# Patient Record
Sex: Female | Born: 1935 | Race: White | Hispanic: No | Smoking: Never smoker
Health system: Southern US, Community
[De-identification: ages and names within clinical notes are randomized; demographics above are authoritative.]

## PROBLEM LIST (undated history)

## (undated) DIAGNOSIS — E785 Hyperlipidemia, unspecified: Secondary | ICD-10-CM

## (undated) DIAGNOSIS — J45909 Unspecified asthma, uncomplicated: Secondary | ICD-10-CM

## (undated) DIAGNOSIS — C801 Malignant (primary) neoplasm, unspecified: Secondary | ICD-10-CM

## (undated) DIAGNOSIS — D1720 Benign lipomatous neoplasm of skin and subcutaneous tissue of unspecified limb: Secondary | ICD-10-CM

## (undated) DIAGNOSIS — I639 Cerebral infarction, unspecified: Secondary | ICD-10-CM

## (undated) DIAGNOSIS — J449 Chronic obstructive pulmonary disease, unspecified: Secondary | ICD-10-CM

## (undated) DIAGNOSIS — K219 Gastro-esophageal reflux disease without esophagitis: Secondary | ICD-10-CM

## (undated) DIAGNOSIS — I10 Essential (primary) hypertension: Secondary | ICD-10-CM

## (undated) DIAGNOSIS — G2581 Restless legs syndrome: Secondary | ICD-10-CM

## (undated) DIAGNOSIS — M1990 Unspecified osteoarthritis, unspecified site: Secondary | ICD-10-CM

## (undated) DIAGNOSIS — S8992XA Unspecified injury of left lower leg, initial encounter: Secondary | ICD-10-CM

## (undated) DIAGNOSIS — M797 Fibromyalgia: Secondary | ICD-10-CM

## (undated) DIAGNOSIS — I739 Peripheral vascular disease, unspecified: Secondary | ICD-10-CM

## (undated) HISTORY — DX: Chronic obstructive pulmonary disease, unspecified: J44.9

## (undated) HISTORY — DX: Essential (primary) hypertension: I10

## (undated) HISTORY — DX: Gastro-esophageal reflux disease without esophagitis: K21.9

## (undated) HISTORY — DX: Restless legs syndrome: G25.81

## (undated) HISTORY — DX: Peripheral vascular disease, unspecified: I73.9

## (undated) HISTORY — DX: Unspecified injury of left lower leg, initial encounter: S89.92XA

## (undated) HISTORY — DX: Cerebral infarction, unspecified: I63.9

## (undated) HISTORY — DX: Hyperlipidemia, unspecified: E78.5

## (undated) HISTORY — DX: Benign lipomatous neoplasm of skin and subcutaneous tissue of unspecified limb: D17.20

## (undated) HISTORY — PX: RECTOCELE REPAIR: SHX761

## (undated) HISTORY — DX: Malignant (primary) neoplasm, unspecified: C80.1

---
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
## 1966-04-13 HISTORY — PX: THYROIDECTOMY: SHX17

## 1979-04-14 HISTORY — PX: ABDOMINAL HYSTERECTOMY: SHX81

## 1984-04-13 HISTORY — PX: BIOPSY BREAST: PRO8

## 1997-04-13 HISTORY — PX: EYE SURGERY: SHX253

## 1998-04-13 HISTORY — PX: EYE SURGERY: SHX253

## 2006-09-08 ENCOUNTER — Ambulatory Visit: Payer: Self-pay | Admitting: Cardiology

## 2007-12-01 ENCOUNTER — Ambulatory Visit: Payer: Self-pay | Admitting: Internal Medicine

## 2007-12-02 ENCOUNTER — Inpatient Hospital Stay (HOSPITAL_COMMUNITY): Admission: EM | Admit: 2007-12-02 | Discharge: 2007-12-03 | Payer: Self-pay | Admitting: Emergency Medicine

## 2007-12-02 ENCOUNTER — Encounter (INDEPENDENT_AMBULATORY_CARE_PROVIDER_SITE_OTHER): Payer: Self-pay | Admitting: Internal Medicine

## 2007-12-02 ENCOUNTER — Ambulatory Visit: Payer: Self-pay | Admitting: Vascular Surgery

## 2008-04-13 DIAGNOSIS — I639 Cerebral infarction, unspecified: Secondary | ICD-10-CM

## 2008-04-13 HISTORY — PX: EYE MUSCLE SURGERY: SHX370

## 2008-04-13 HISTORY — DX: Cerebral infarction, unspecified: I63.9

## 2010-08-26 NOTE — Consult Note (Signed)
[name], [name]                ACCOUNT NO.:  [id number]   MEDICAL RECORD NO.:  [id number]          PATIENT TYPE:  INP   LOCATION:  3003                         FACILITY:  [facility]   PHYSICIAN:  Deanna Artis. Hickling, M.D.DATE OF BIRTH:  December 08, 1921   DATE OF CONSULTATION:  12/02/2007  DATE OF DISCHARGE:                                 CONSULTATION   CHIEF COMPLAINT:  Left thalamic subacute infarction.   HISTORY OF THE PRESENT CONDITION:  The patient is an 77 year old married  right-handed woman with mild hypertension and untreated dyslipidemia  that is also mild.  The patient has been well virtually all of her life.  On Friday night, the patient experienced onset of lightheadedness and  tingling in both her hands.  She was able to get up and in fact she said  that she walked to the floor.  She finally retired around 10:30 or 11  and slept until the next morning.  On Saturday morning, the patient  noted that she had numbness and tingling on the right side.  I would  also like to mention that on Friday, the patient fell and sprained her  left ankle.  I do not think that had anything to do with her stroke.   On Sunday morning, she continued to have symptoms of right-sided  weakness and numbness and was seen by her primary physician on Monday,  who carried out an MRI scan and is going to have her worked up as an  outpatient in [place] and be seen by Dr. Lesia Sago at [facility] on December 08, 2007.  Her family, however, felt  that she should have workup more promptly and the patient was thus  admitted to [facility].   Her evaluation today shows an MRA that shows some mild intracranial  atherosclerotic disease with some narrowing in the proximal vessels, but  no definite stenosis in the vertebral arteries or the carotids.  There  is some narrowing distally in the basilar artery, but robust vessels  coming off the superior cerebellar and posterior cerebral  arteries.  Distally, in all of the branches, there is some mild irregularities that  suggest atherosclerotic disease.   The patient has a blood pressure now of 131/68.  She has a lipid panel  that showed a total cholesterol of 200, LDL 122, HDL 59, and  triglyceride 95.  She is not on statin medications.  The patient was  placed on Aggrenox, since developed some headache.  We are asked to see  the patient to determine whether any further workup or treatment was  needed.   Further workup shows 60-80% stenosis of the left internal carotid artery  and 40-60% of the right internal carotid artery.  The right was  described at the lower end of the scale and the left, in low to mid  range.  There is bilateral vertebral artery flow.  No description of the  carotids suggested ulcerative plaque.   A 2-D echocardiogram has been performed and is pending at this time.   REVIEW OF SYSTEMS:  Remarkable for trauma  to her ankle and had decreased  vision related to macular degeneration bilaterally.  She also has  alteration of vision and distortions of vision.  She has borderline  diabetes mellitus with a fasting glucose of 181, but hemoglobin A1c that  is only 6.2.  This has not been treated.  She has arthritis, stiffness,  joint pain, decreased range of motion, and numbness and tingling in her  hands and feet.  A 12-system review is otherwise negative.   Other medical problems include hypothyroidism, gastroesophageal reflux,  urinary incontinence, rheumatoid arthritis, fibromyalgia, and chronic  obstructive pulmonary disease.   MEDICATIONS:  1. Amitriptyline 50 mg at bedtime.  2. Aggrenox 1 capsule twice daily.  3. Symbicort 2 puffs daily.  4. Ciprofloxacin 200 mg every 12 hours.  5. Lovenox 40 mg subcutaneously daily.  6. Flonase 2 puffs daily.  7. Multivitamins daily.  8. Protonix 40 mg at noon.  9. Detrol 4 mg daily.  She also is suppose to be on Synthroid, but I do not see it in her   orders.  Esterase, triamterene hydrochlorothiazide, Ultram, Nexium,  amitriptyline, and Mucinex.  She was on aspirin 325 mg, this has been  stopped.   DRUG ALLERGIES:  None known.   FAMILY HISTORY:  Negative for others with stroke or heart disease.   SOCIAL HISTORY:  The patient had a very limited history of smoking, has  not smoked any cigarettes in decades.  She does not use alcohol and  drugs.  She is married nearly for 62 years and lives in [place], [place].  She is retired.   PHYSICAL EXAMINATION:  GENERAL:  On examination today, this is a well-  developed, well-nourished, tall, right-handed woman in no distress.  VITAL SIGNS:  Blood pressure 131/68, resting pulse 96, respirations 20,  temperature 96.3, and oxygen saturation 98%.  HEAD, EYES, EARS, NOSE, AND THROAT:  No signs of infection.  NECK:  Supple neck.  Full range of motion.  No cranial or cervical  bruits.  LUNGS:  Clear to auscultation.  HEART:  No murmurs.  Pulses normal.  ABDOMEN:  Soft and nontender.  Bowel sounds normal.  EXTREMITIES:  Well-formed without edema, cyanosis, alterations, tone, or  tight heel cords.  SKIN:  No lesions.  VASCULAR:  Tone was normal.  NEUROLOGIC:  Mental status:  The patient was awake, alert, attentive,  and appropriate.  She said that she still feels odd.  She has a dull  headache.  She has some numbness in both hands, right greater than left.  She has no dysphasia or dyspraxia.  Cranial nerves:  Round and reactive  pupils.  Pupils are slightly irregular.  She has bilateral macular  degeneration.  Pupils are equal in reaction, do not show afferent  pupillary defect.  Visual fields are full to double simultaneous  stimuli, but she has very significant visual acuity, difficulties in the  left eye and does not enhance significant distortions of vision in the  right eye.  Symmetric facial strength.  Midline tongue and uvula.  Air  conduction greater than bone conduction bilaterally.   Motor examination:  Normal strength, tone, and mass.  Good fine motor movements.  No  pronator drift.  Sensation is slightly altered.  She does not have  hemisensory.  She has mild peripheral stocking neuropathy.  Cerebellar  examination:  Good finger-to-nose, heel-knee-shin, and rapid alternating  movements.  Gait and station was slightly broad-based, but steady.  She  can walk on her  toes and heels briefly.  Negative Romberg.  Deep tendon  reflexes were symmetric and diminished.  The patient had bilateral  flexor plantar responses.   IMPRESSION:  1. Subacute left thalamic infarction. (434.01)  2. Risk factors for stroke include hypertension, dyslipidemia, and      incipient diabetes mellitus.   RECOMMENDATIONS:  I discussed risk factors for her.  She will need to  start on a statin medication and I recommend either a low-dose Lipitor  or Zocor.  We should think about an oral hypoglycemic for her because of  her diabetes.  Synthroid needs to be restarted, it has not been.  Also,  her other home medications.  She may need increase in her triamterene  hydrochlorothiazide but that can be done as an outpatient.  She passed  her swallow screen, but she had a subacute presentation, so was eating  long after she had onset of her symptoms, but was not allowed to eat in  our hospital until she passed her swallowing screen.  She has no need  for OT or PT at this time.  We will x-ray her left foot and make certain  that it is not fracture.  I discussed management with Dr. Olena Leatherwood.  It  is fine to discharge the patient in the morning.  I believe this is a  lacunar infarction and therefore, no reason for left carotid  endarterectomy.  In addition, if the patient had an embolic process  which is possible, it would have been vertebral-basilar involvement.  Though the basilar artery is narrowed, in my opinion, it is not stenotic  enough to consider stenting at this time.   I appreciate the  opportunity to participate in her care.      Deanna Artis. Sharene Skeans, M.D.  Electronically Signed     [facility]/MEDQ  D:  12/02/2007  T:  12/03/2007  Job:  [id number]   cc:   Ladell Pier, M.D.  Kirstie Peri, MD

## 2010-08-26 NOTE — H&P (Signed)
[name], [name] NO.:  [id number]   MEDICAL RECORD NO.:  [id number]          PATIENT TYPE:  INP   LOCATION:  3003                         FACILITY:  [facility]   PHYSICIAN:  Eduard Clos, MDDATE OF BIRTH:  1922-01-29   DATE OF ADMISSION:  12/01/2007  DATE OF DISCHARGE:                              HISTORY & PHYSICAL   PRIMARY CARE PHYSICIAN:  The patient's primary care physician is in  [place].   CHIEF COMPLAINT:  Numbness in both hands and abnormal MRI.   HISTORY OF THE PRESENTING ILLNESS:  The patient is an 77 year old female  with a history of rheumatoid arthritis, fibromyalgia, macular  degeneration and COPD who has had symptoms of numbness involving her  hands and face last Saturday, which lasted for around two hours.  She  denies any weakness in her  limbs, loss of consciousness or any facial  asymmetry.  The patient slept all night and woke up Sunday morning when  again had these symptoms.  She went to see her primary care physician on  Monday and MRI of the brain was ordered, which showed a small acute left  thalamic infarct; and, the patient, thus, was referred to the ER.   Presenting the patient still has some numbness in both hands, but denies  any weakness in her limbs.  She has mild nausea and vomiting, and a mild  frontal headache after arriving at the ER.  The patient also has been  having deterioration in her vision in both eyes for which she has been  followed by her ophthalmologist and she was diagnosed with macular  degeneration; however, over the last six months her vision has  progressively became worse.  The patient denies any chest pain, loss of  consciousness, shortness of breath, hypertension, and any weakness in  the limbs or facial weakness.  She denies fever or chills, any abdominal  pain, and denies any dysuria or discharges.   PAST MEDICAL HISTORY:  1. Rheumatoid arthritis.  2. Hypothyroidism status post thyroidectomy.  3. Fibromyalgia.  4. Hypertension.  5. Macular degeneration.   PAST SURGICAL HISTORY:  1. Hysterectomy.  2. Thyroidectomy for an adenoma, which was benign.   MEDICATIONS:  The patient's medications prior to admission include:  1. Synthroid; dose not known.  2. Estrace 2 mg by mouth daily.  3. Triamterene hydrochlorothiazide 37.5/25.  4. Ultram 50 mg as needed.  5. Nexium 40 mg by mouth daily.  6. Amitriptyline 50 mg by mouth nightly.  7. Mucinex.  8. Restasis.  9. Regular aspirin 325 mg by mouth daily.  10.Flonase 50 mg, 2 puffs to each nostril.  11.Combivent 2 puffs every 6 hours as needed.  12.Calcium 600 mg by mouth daily.  13.Multivitamin.  14.Detrol LA.  15.The patient was just begun on Aggrenox today by her primary care      physician.  16.The patient also uses Symbicort and Dionex as needed.   ALLERGIES:  No known drug allergies.   SOCIAL HISTORY:  The patient denies smoking cigarettes, drinking alcohol  or using illegal drugs.   REVIEW OF SYSTEMS:  The review of systems is as per the history of  present illness with nothing else significant.   PHYSICAL EXAMINATION:  GENERAL APPEARANCE:  The patient is examined at  the bedside and is not in acute distress.  VITAL SIGNS:  Blood pressure is 197/90, pulse is 80/minute, temperature  is 97.8, respirations are 18/minute, and O2 sat is 100%.  HEENT:  Atraumatic .  No facial droop.  Tongue is in the midline.  PERRLA positive.  The patient has poor visual acuity in both her eyes,  which she states is chronic.  CHEST:  Bilateral air entry present.  No rhonchi.  No crepitation.  HEART:  S1 and S2 heard.  ABDOMEN:  The abdomen is soft, nontender, and bowel sounds are heard.  NEUROLOGIC EXAMINATION:  CNS - the patient is alert and oriented to  time, place and person.  Motor strength in the upper and lower  extremities is 5/5.  EXTREMITIES:  The extremities reveal peripheral pulses are felt.  There  is erythema of the left  foot where she sustained an injury after a fall  at which she states she did not lose consciousness. Pulses are felt.   LABORATORY DATA:  MRI of the brain done at [facility] on December 01, 2007 at 8:50 A.M. showed a small acute left thalamic infarct,  moderate cortical volume loss and chronic ischemia microvascular change.  CBC; WBC 11.7, hemoglobin 13.6, hematocrit 40 and platelets 322,000.  Basic metabolic panel; sodium 131, potassium 3.7, chloride 97, glucose  116, BUN 8, and creatinine 1.  Urinalysis; trace leukocytes, WBC 3-6 and  a few bacteria.   ASSESSMENT:  1. Acute left thalamic infarct.  2. Hypertension.  3. Urinary tract infection.  4. History of rheumatoid arthritis.  5. Fibromyalgia.  6. Chronic obstructive pulmonary disease.  7. History of macular degeneration.   PLAN:  1. Admit to telemetry.  2. Frequent neuro checks.  3. Obtain urine cultures.  4. Continue Aggrenox.  5. Obtain an MRA of the brain,carotid Dopplers and 2-D echocardiogram.  6. Urology consultation in the A.M.  7. Further recommendations as patients condition evolves .      Eduard Clos, MD  Electronically Signed     ANK/MEDQ  D:  12/02/2007  T:  12/02/2007  Job:  [id number]

## 2010-11-07 ENCOUNTER — Encounter (INDEPENDENT_AMBULATORY_CARE_PROVIDER_SITE_OTHER): Payer: Medicare Other | Admitting: Ophthalmology

## 2010-11-07 DIAGNOSIS — H3530 Unspecified macular degeneration: Secondary | ICD-10-CM

## 2010-11-07 DIAGNOSIS — H43819 Vitreous degeneration, unspecified eye: Secondary | ICD-10-CM

## 2010-11-07 DIAGNOSIS — H353290 Exudative age-related macular degeneration, unspecified eye, stage unspecified: Secondary | ICD-10-CM

## 2011-02-06 ENCOUNTER — Encounter (INDEPENDENT_AMBULATORY_CARE_PROVIDER_SITE_OTHER): Payer: Medicare Other | Admitting: Ophthalmology

## 2011-02-06 DIAGNOSIS — H353290 Exudative age-related macular degeneration, unspecified eye, stage unspecified: Secondary | ICD-10-CM

## 2011-02-06 DIAGNOSIS — H43819 Vitreous degeneration, unspecified eye: Secondary | ICD-10-CM

## 2011-02-06 DIAGNOSIS — H3530 Unspecified macular degeneration: Secondary | ICD-10-CM

## 2011-05-08 ENCOUNTER — Encounter (INDEPENDENT_AMBULATORY_CARE_PROVIDER_SITE_OTHER): Payer: Medicare Other | Admitting: Ophthalmology

## 2011-05-14 ENCOUNTER — Encounter (INDEPENDENT_AMBULATORY_CARE_PROVIDER_SITE_OTHER): Payer: Medicare Other | Admitting: Ophthalmology

## 2011-05-14 DIAGNOSIS — H353290 Exudative age-related macular degeneration, unspecified eye, stage unspecified: Secondary | ICD-10-CM

## 2011-05-14 DIAGNOSIS — H3530 Unspecified macular degeneration: Secondary | ICD-10-CM

## 2011-05-14 DIAGNOSIS — H43819 Vitreous degeneration, unspecified eye: Secondary | ICD-10-CM

## 2011-08-04 ENCOUNTER — Other Ambulatory Visit: Payer: Self-pay | Admitting: *Deleted

## 2011-08-04 DIAGNOSIS — G459 Transient cerebral ischemic attack, unspecified: Secondary | ICD-10-CM

## 2011-08-05 ENCOUNTER — Encounter (INDEPENDENT_AMBULATORY_CARE_PROVIDER_SITE_OTHER): Payer: Medicare Other

## 2011-08-05 DIAGNOSIS — G459 Transient cerebral ischemic attack, unspecified: Secondary | ICD-10-CM

## 2011-08-05 DIAGNOSIS — I6529 Occlusion and stenosis of unspecified carotid artery: Secondary | ICD-10-CM

## 2011-08-26 ENCOUNTER — Encounter (INDEPENDENT_AMBULATORY_CARE_PROVIDER_SITE_OTHER): Payer: Medicare Other | Admitting: Ophthalmology

## 2011-08-26 DIAGNOSIS — H43819 Vitreous degeneration, unspecified eye: Secondary | ICD-10-CM

## 2011-08-26 DIAGNOSIS — H353290 Exudative age-related macular degeneration, unspecified eye, stage unspecified: Secondary | ICD-10-CM

## 2011-08-26 DIAGNOSIS — H3530 Unspecified macular degeneration: Secondary | ICD-10-CM

## 2011-10-12 DIAGNOSIS — S8992XA Unspecified injury of left lower leg, initial encounter: Secondary | ICD-10-CM

## 2011-10-12 HISTORY — DX: Unspecified injury of left lower leg, initial encounter: S89.92XA

## 2011-11-25 ENCOUNTER — Encounter (INDEPENDENT_AMBULATORY_CARE_PROVIDER_SITE_OTHER): Payer: Medicare Other | Admitting: Ophthalmology

## 2011-11-25 DIAGNOSIS — H3530 Unspecified macular degeneration: Secondary | ICD-10-CM

## 2011-11-25 DIAGNOSIS — H35039 Hypertensive retinopathy, unspecified eye: Secondary | ICD-10-CM

## 2011-11-25 DIAGNOSIS — H43819 Vitreous degeneration, unspecified eye: Secondary | ICD-10-CM

## 2011-11-25 DIAGNOSIS — I10 Essential (primary) hypertension: Secondary | ICD-10-CM

## 2011-11-25 DIAGNOSIS — H353290 Exudative age-related macular degeneration, unspecified eye, stage unspecified: Secondary | ICD-10-CM

## 2011-12-22 ENCOUNTER — Other Ambulatory Visit: Payer: Self-pay

## 2011-12-22 DIAGNOSIS — I739 Peripheral vascular disease, unspecified: Secondary | ICD-10-CM

## 2012-01-06 ENCOUNTER — Encounter (INDEPENDENT_AMBULATORY_CARE_PROVIDER_SITE_OTHER): Payer: Medicare Other | Admitting: Ophthalmology

## 2012-01-06 DIAGNOSIS — H43819 Vitreous degeneration, unspecified eye: Secondary | ICD-10-CM

## 2012-01-06 DIAGNOSIS — H35039 Hypertensive retinopathy, unspecified eye: Secondary | ICD-10-CM

## 2012-01-06 DIAGNOSIS — H3530 Unspecified macular degeneration: Secondary | ICD-10-CM

## 2012-01-06 DIAGNOSIS — H353290 Exudative age-related macular degeneration, unspecified eye, stage unspecified: Secondary | ICD-10-CM

## 2012-01-06 DIAGNOSIS — I10 Essential (primary) hypertension: Secondary | ICD-10-CM

## 2012-01-13 ENCOUNTER — Encounter: Payer: Self-pay | Admitting: Vascular Surgery

## 2012-01-13 ENCOUNTER — Encounter: Payer: Medicare Other | Admitting: *Deleted

## 2012-01-13 ENCOUNTER — Ambulatory Visit (INDEPENDENT_AMBULATORY_CARE_PROVIDER_SITE_OTHER): Payer: Medicare Other | Admitting: Vascular Surgery

## 2012-01-13 ENCOUNTER — Encounter (INDEPENDENT_AMBULATORY_CARE_PROVIDER_SITE_OTHER): Payer: Medicare Other | Admitting: *Deleted

## 2012-01-13 VITALS — BP 155/65 | HR 78 | Resp 16 | Ht 68.0 in | Wt 168.1 lb

## 2012-01-13 DIAGNOSIS — I739 Peripheral vascular disease, unspecified: Secondary | ICD-10-CM | POA: Insufficient documentation

## 2012-01-13 DIAGNOSIS — L98499 Non-pressure chronic ulcer of skin of other sites with unspecified severity: Secondary | ICD-10-CM

## 2012-01-13 DIAGNOSIS — I7025 Atherosclerosis of native arteries of other extremities with ulceration: Secondary | ICD-10-CM | POA: Insufficient documentation

## 2012-01-13 NOTE — Progress Notes (Signed)
[facility] [place]  Patient name: [name] MRN: [id number] DOB: Jan 04, 1922 Sex: female  REASON FOR CONSULT: pretibial wound left leg. Referred by Dr. Sherryll Burger  HPI: [name] is a 78 y.o. female who injured her left leg approximately 3 months ago on the stem of a planned in the garden. This wound has not healed. She has been putting peroxide on it. She had a previous arterial Doppler study year ago which suggested some and infrainguinal arterial occlusive disease and based on this she was referred here for vascular consultation.  This patient does describe bilateral lower extremity claudication. She experiences claudication symptoms in both legs associated with ambulation and relieved with rest. Her symptoms are more significant on the left side. She experienced pain in her thighs and calves with ambulation. She denies rest pain. She's had no fever or chills. She's had no significant drainage from the wound. She does have varicose veins but denies any history of DVT or phlebitis.  Past Medical History  Diagnosis Date  . Esophageal reflux   . Peripheral arterial disease     Left Lateral leg ulcer  . Injury of left leg July 2013    Patient hit left leg  . Restless legs syndrome   . Lipoma of arm     Right arm  . Hyperlipidemia   . COPD (chronic obstructive pulmonary disease)   . Stroke 2010    Right hand pain after stroke  . Cancer     SCC: Face  . Hypertension     Family History  Problem Relation Age of Onset  . Cancer Mother   . Heart disease Father   . Cancer Sister     Breast  . Cancer Brother   . Cancer Daughter     BCC: back and face  . Diabetes Daughter   . Other Maternal Grandmother     Cramps in legs  . Cancer Sister   . Cancer Sister   . Cancer Brother   . Cancer Brother     SOCIAL HISTORY: History  Substance Use Topics  . Smoking status: Never Smoker   . Smokeless tobacco: Not on file  . Alcohol Use: No    Not on  File  Current Outpatient Prescriptions  Medication Sig Dispense Refill  . amitriptyline (ELAVIL) 50 MG tablet       . amLODipine (NORVASC) 5 MG tablet daily.      Marland Kitchen aspirin 81 MG tablet Take 81 mg by mouth daily.      . calcium carbonate (OS-CAL) 600 MG TABS Take 600 mg by mouth daily.      . celecoxib (CELEBREX) 200 MG capsule Take 200 mg by mouth daily.      Marland Kitchen esomeprazole (NEXIUM) 40 MG capsule Take 40 mg by mouth daily before breakfast.      . fluticasone (FLONASE) 50 MCG/ACT nasal spray Place 1 spray into the nose daily.      Marland Kitchen gabapentin (NEURONTIN) 100 MG capsule Take 100 mg by mouth 3 (three) times daily.       . hydrochlorothiazide (HYDRODIURIL) 25 MG tablet Take 12.5 mg by mouth daily.       Marland Kitchen HYDROMET 5-1.5 MG/5ML syrup       . Ipratropium-Albuterol (COMBIVENT IN) Inhale into the lungs as needed.      Marland Kitchen ipratropium-albuterol (DUONEB) 0.5-2.5 (3) MG/3ML SOLN       . levothyroxine (SYNTHROID, LEVOTHROID) 88 MCG tablet 88 mcg daily.       Marland Kitchen  lisinopril (PRINIVIL,ZESTRIL) 40 MG tablet 40 mg daily.       . Melatonin 3 MG TABS Take 3 mg by mouth daily.      . metoprolol tartrate (LOPRESSOR) 25 MG tablet Take 25 mg by mouth daily.      . montelukast (SINGULAIR) 10 MG tablet Take 10 mg by mouth daily.       . Multiple Vitamin (MULTIVITAMIN) tablet Take 1 tablet by mouth daily.      . Multiple Vitamins-Minerals (PRESERVISION AREDS PO) Take by mouth daily.      . polyethylene glycol powder (MIRALAX) powder Take 1 g by mouth daily.      Marland Kitchen PREVIDENT 5000 DRY MOUTH 1.1 % PSTE       . RESTASIS 0.05 % ophthalmic emulsion       . rOPINIRole (REQUIP) 1 MG tablet 1 mg at bedtime.       . simvastatin (ZOCOR) 20 MG tablet Take 20 mg by mouth daily.       . SYMBICORT 160-4.5 MCG/ACT inhaler Twice daily.      . TOPROL XL 25 MG 24 hr tablet 25 mg daily.       . traMADol (ULTRAM) 50 MG tablet Take 100 mg by mouth 3 (three) times daily.      . beta carotene w/minerals (OCUVITE) tablet Take 1 tablet  by mouth daily.      Marland Kitchen estradiol (ESTRACE) 0.1 MG/GM vaginal cream Place 1 g vaginally as needed.      . hyoscyamine (CYSTOSPAZ) 0.15 MG tablet Take 0.15 mg by mouth as needed.        REVIEW OF SYSTEMS: Arly.Keller ] denotes positive finding; [  ] denotes negative finding  CARDIOVASCULAR:  [ ]  chest pain   [ ]  chest pressure   [ ]  palpitations   [ ]  orthopnea   [ ]  dyspnea on exertion   Arly.Keller ] claudication   [ ]  rest pain   [ ]  DVT   [ ]  phlebitis PULMONARY:   [ ]  productive cough   Arly.Keller ] asthma   Arly.Keller ] wheezing NEUROLOGIC:   [ ]  weakness  [ ]  paresthesias  [ ]  aphasia  [ ]  amaurosis  Arly.Keller ] dizziness HEMATOLOGIC:   [ ]  bleeding problems   [ ]  clotting disorders MUSCULOSKELETAL:  [ ]  joint pain   [ ]  joint swelling Arly.Keller ] leg swelling GASTROINTESTINAL: [ ]   blood in stool  [ ]   hematemesis GENITOURINARY:  [ ]   dysuria  [ ]   hematuria PSYCHIATRIC:  [ ]  history of major depression INTEGUMENTARY:  [ ]  rashes  [ ]  ulcers CONSTITUTIONAL:  [ ]  fever   [ ]  chills  PHYSICAL EXAM: Filed Vitals:   01/13/12 1122  BP: 155/65  Pulse: 78  Resp: 16  Height: 5\' 8"  (1.727 m)  Weight: 168 lb 1.6 oz (76.25 kg)  SpO2: 99%   Body mass index is 25.56 kg/(m^2). GENERAL: The patient is a well-nourished female, in no acute distress. The vital signs are documented above. CARDIOVASCULAR: There is a regular rate and rhythm with a systolic ejection murmur.. She has a right carotid bruit. She has palpable femoral pulses. On the left side, I can palpate a popliteal pulse although slightly diminished. I cannot palpate pedal pulses. She has mild left lower extremity swelling. On the right side I cannot palpate popliteal or pedal pulses. PULMONARY: There is good air exchange bilaterally without wheezing or rales. ABDOMEN: Soft and non-tender with normal pitched  bowel sounds.  MUSCULOSKELETAL: There are no major deformities or cyanosis. NEUROLOGIC: No focal weakness or paresthesias are detected. SKIN: she has a punctate wound of her  distal left leg anteriorly measures 2 cm in maximum diameter. There is minimal granulation tissue. There is no significant drainage. There is mild erythema. PSYCHIATRIC: The patient has a normal affect.  DATA:  I have reviewed her arterial Doppler studies which was done a year ago. This showed elevated velocities within the mid to distal segments of the superficial femoral artery. The vessels were noncompressible and ABIs were not obtained.  I have independently interpreted the arterial Doppler study in our office which shows an ABI of 68% on the right and 98% on the left. There was some medial calcinosis on the left so I suspect the left ABI is falsely elevated. She has monophasic Doppler signals in the dorsalis pedis and posterior tibial arteries bilaterally. Her toe pressure on the right was 52 mmHg. Toe pressure on the left is 40 mm of mercury.  MEDICAL ISSUES: This patient has a slowly healing wound of the left leg. She has evidence of infrainguinal arterial occlusive disease on the left. She likely has some mild SFA disease and also tibial artery occlusive disease. The toe pressure of 40 on the left suggest marginal perfusion for healing of the wound. Generally a pressure greater of 40 is adequate although this is right on the borderline. I think it would be worth referring her to the wound care center aggressive outpatient treatment of the wound. I think is a reasonable chance this will heal without any further intervention. If the wound does not heal or progresses in any way that I would recommend that we proceed with arteriography to see what options he might have for revascularization. It's possible that she has some distal superficial femoral artery occlusive disease amenable to angioplasty. Certainly be best to avoid surgery if at all possible given her age. I plan on seeing her back in 6 weeks. She knows to call sooner if she has any problems.  With respect to her right carotid bruit, she  states that she is followed with carotid duplex scans by one of her physicians closer to home.  Cleon Signorelli S [facility] [place] Beeper: [phone]

## 2012-02-17 ENCOUNTER — Ambulatory Visit: Payer: Medicare Other | Admitting: Vascular Surgery

## 2012-02-17 ENCOUNTER — Encounter (INDEPENDENT_AMBULATORY_CARE_PROVIDER_SITE_OTHER): Payer: Medicare Other | Admitting: Ophthalmology

## 2012-02-17 DIAGNOSIS — H3530 Unspecified macular degeneration: Secondary | ICD-10-CM

## 2012-02-17 DIAGNOSIS — I10 Essential (primary) hypertension: Secondary | ICD-10-CM

## 2012-02-17 DIAGNOSIS — H353290 Exudative age-related macular degeneration, unspecified eye, stage unspecified: Secondary | ICD-10-CM

## 2012-02-17 DIAGNOSIS — H35039 Hypertensive retinopathy, unspecified eye: Secondary | ICD-10-CM

## 2012-02-17 DIAGNOSIS — H43819 Vitreous degeneration, unspecified eye: Secondary | ICD-10-CM

## 2012-03-30 ENCOUNTER — Encounter (INDEPENDENT_AMBULATORY_CARE_PROVIDER_SITE_OTHER): Payer: Medicare Other | Admitting: Ophthalmology

## 2012-03-30 DIAGNOSIS — H353290 Exudative age-related macular degeneration, unspecified eye, stage unspecified: Secondary | ICD-10-CM

## 2012-03-30 DIAGNOSIS — H3530 Unspecified macular degeneration: Secondary | ICD-10-CM

## 2012-03-30 DIAGNOSIS — H35039 Hypertensive retinopathy, unspecified eye: Secondary | ICD-10-CM

## 2012-03-30 DIAGNOSIS — I10 Essential (primary) hypertension: Secondary | ICD-10-CM

## 2012-03-30 DIAGNOSIS — H43819 Vitreous degeneration, unspecified eye: Secondary | ICD-10-CM

## 2012-05-18 ENCOUNTER — Encounter (INDEPENDENT_AMBULATORY_CARE_PROVIDER_SITE_OTHER): Payer: Medicare Other | Admitting: Ophthalmology

## 2012-05-18 DIAGNOSIS — H43819 Vitreous degeneration, unspecified eye: Secondary | ICD-10-CM

## 2012-05-18 DIAGNOSIS — H353290 Exudative age-related macular degeneration, unspecified eye, stage unspecified: Secondary | ICD-10-CM

## 2012-05-18 DIAGNOSIS — I10 Essential (primary) hypertension: Secondary | ICD-10-CM

## 2012-05-18 DIAGNOSIS — H35039 Hypertensive retinopathy, unspecified eye: Secondary | ICD-10-CM

## 2012-05-18 DIAGNOSIS — H3530 Unspecified macular degeneration: Secondary | ICD-10-CM

## 2012-07-20 ENCOUNTER — Encounter (INDEPENDENT_AMBULATORY_CARE_PROVIDER_SITE_OTHER): Payer: Medicare Other | Admitting: Ophthalmology

## 2012-07-20 DIAGNOSIS — H43819 Vitreous degeneration, unspecified eye: Secondary | ICD-10-CM

## 2012-07-20 DIAGNOSIS — H35039 Hypertensive retinopathy, unspecified eye: Secondary | ICD-10-CM

## 2012-07-20 DIAGNOSIS — H3530 Unspecified macular degeneration: Secondary | ICD-10-CM

## 2012-07-20 DIAGNOSIS — H353290 Exudative age-related macular degeneration, unspecified eye, stage unspecified: Secondary | ICD-10-CM

## 2012-07-20 DIAGNOSIS — I10 Essential (primary) hypertension: Secondary | ICD-10-CM

## 2012-09-28 ENCOUNTER — Encounter (INDEPENDENT_AMBULATORY_CARE_PROVIDER_SITE_OTHER): Payer: Medicare Other | Admitting: Ophthalmology

## 2012-09-28 DIAGNOSIS — I10 Essential (primary) hypertension: Secondary | ICD-10-CM

## 2012-09-28 DIAGNOSIS — H43819 Vitreous degeneration, unspecified eye: Secondary | ICD-10-CM

## 2012-09-28 DIAGNOSIS — H353290 Exudative age-related macular degeneration, unspecified eye, stage unspecified: Secondary | ICD-10-CM

## 2012-09-28 DIAGNOSIS — H3530 Unspecified macular degeneration: Secondary | ICD-10-CM

## 2012-09-28 DIAGNOSIS — H35039 Hypertensive retinopathy, unspecified eye: Secondary | ICD-10-CM

## 2012-12-14 ENCOUNTER — Encounter (INDEPENDENT_AMBULATORY_CARE_PROVIDER_SITE_OTHER): Payer: Medicare Other | Admitting: Ophthalmology

## 2012-12-14 DIAGNOSIS — H353290 Exudative age-related macular degeneration, unspecified eye, stage unspecified: Secondary | ICD-10-CM

## 2012-12-14 DIAGNOSIS — H43819 Vitreous degeneration, unspecified eye: Secondary | ICD-10-CM

## 2012-12-14 DIAGNOSIS — H35039 Hypertensive retinopathy, unspecified eye: Secondary | ICD-10-CM

## 2012-12-14 DIAGNOSIS — I10 Essential (primary) hypertension: Secondary | ICD-10-CM

## 2013-03-15 ENCOUNTER — Encounter (INDEPENDENT_AMBULATORY_CARE_PROVIDER_SITE_OTHER): Payer: Medicare Other | Admitting: Ophthalmology

## 2013-03-15 DIAGNOSIS — H35039 Hypertensive retinopathy, unspecified eye: Secondary | ICD-10-CM

## 2013-03-15 DIAGNOSIS — I10 Essential (primary) hypertension: Secondary | ICD-10-CM

## 2013-03-15 DIAGNOSIS — H43819 Vitreous degeneration, unspecified eye: Secondary | ICD-10-CM

## 2013-03-15 DIAGNOSIS — H3530 Unspecified macular degeneration: Secondary | ICD-10-CM

## 2013-03-15 DIAGNOSIS — H353290 Exudative age-related macular degeneration, unspecified eye, stage unspecified: Secondary | ICD-10-CM

## 2013-06-28 ENCOUNTER — Encounter (INDEPENDENT_AMBULATORY_CARE_PROVIDER_SITE_OTHER): Payer: Medicare Other | Admitting: Ophthalmology

## 2013-06-28 DIAGNOSIS — I10 Essential (primary) hypertension: Secondary | ICD-10-CM

## 2013-06-28 DIAGNOSIS — H3530 Unspecified macular degeneration: Secondary | ICD-10-CM

## 2013-06-28 DIAGNOSIS — H43819 Vitreous degeneration, unspecified eye: Secondary | ICD-10-CM

## 2013-06-28 DIAGNOSIS — H353290 Exudative age-related macular degeneration, unspecified eye, stage unspecified: Secondary | ICD-10-CM

## 2013-06-28 DIAGNOSIS — H35039 Hypertensive retinopathy, unspecified eye: Secondary | ICD-10-CM

## 2013-10-25 ENCOUNTER — Encounter (INDEPENDENT_AMBULATORY_CARE_PROVIDER_SITE_OTHER): Payer: Medicare Other | Admitting: Ophthalmology

## 2013-10-25 DIAGNOSIS — H3530 Unspecified macular degeneration: Secondary | ICD-10-CM

## 2013-10-25 DIAGNOSIS — I10 Essential (primary) hypertension: Secondary | ICD-10-CM

## 2013-10-25 DIAGNOSIS — H43819 Vitreous degeneration, unspecified eye: Secondary | ICD-10-CM

## 2013-10-25 DIAGNOSIS — H35039 Hypertensive retinopathy, unspecified eye: Secondary | ICD-10-CM

## 2013-10-25 DIAGNOSIS — H353290 Exudative age-related macular degeneration, unspecified eye, stage unspecified: Secondary | ICD-10-CM

## 2014-02-28 ENCOUNTER — Encounter (INDEPENDENT_AMBULATORY_CARE_PROVIDER_SITE_OTHER): Payer: Medicare Other | Admitting: Ophthalmology

## 2014-02-28 DIAGNOSIS — H35033 Hypertensive retinopathy, bilateral: Secondary | ICD-10-CM

## 2014-02-28 DIAGNOSIS — H43813 Vitreous degeneration, bilateral: Secondary | ICD-10-CM

## 2014-02-28 DIAGNOSIS — H3531 Nonexudative age-related macular degeneration: Secondary | ICD-10-CM

## 2014-02-28 DIAGNOSIS — H3532 Exudative age-related macular degeneration: Secondary | ICD-10-CM

## 2014-02-28 DIAGNOSIS — I10 Essential (primary) hypertension: Secondary | ICD-10-CM

## 2014-06-27 ENCOUNTER — Encounter (INDEPENDENT_AMBULATORY_CARE_PROVIDER_SITE_OTHER): Payer: Medicare Other | Admitting: Ophthalmology

## 2014-06-27 DIAGNOSIS — H43813 Vitreous degeneration, bilateral: Secondary | ICD-10-CM | POA: Diagnosis not present

## 2014-06-27 DIAGNOSIS — H3531 Nonexudative age-related macular degeneration: Secondary | ICD-10-CM | POA: Diagnosis not present

## 2014-06-27 DIAGNOSIS — I10 Essential (primary) hypertension: Secondary | ICD-10-CM

## 2014-06-27 DIAGNOSIS — H35033 Hypertensive retinopathy, bilateral: Secondary | ICD-10-CM

## 2014-09-12 ENCOUNTER — Encounter (INDEPENDENT_AMBULATORY_CARE_PROVIDER_SITE_OTHER): Payer: Medicare Other | Admitting: Ophthalmology

## 2014-09-12 DIAGNOSIS — H43813 Vitreous degeneration, bilateral: Secondary | ICD-10-CM | POA: Diagnosis not present

## 2014-09-12 DIAGNOSIS — I10 Essential (primary) hypertension: Secondary | ICD-10-CM

## 2014-09-12 DIAGNOSIS — H3531 Nonexudative age-related macular degeneration: Secondary | ICD-10-CM | POA: Diagnosis not present

## 2014-09-12 DIAGNOSIS — H35033 Hypertensive retinopathy, bilateral: Secondary | ICD-10-CM | POA: Diagnosis not present

## 2014-12-26 ENCOUNTER — Encounter (INDEPENDENT_AMBULATORY_CARE_PROVIDER_SITE_OTHER): Payer: Medicare Other | Admitting: Ophthalmology

## 2014-12-26 DIAGNOSIS — H43813 Vitreous degeneration, bilateral: Secondary | ICD-10-CM | POA: Diagnosis not present

## 2014-12-26 DIAGNOSIS — H3531 Nonexudative age-related macular degeneration: Secondary | ICD-10-CM

## 2014-12-26 DIAGNOSIS — H35033 Hypertensive retinopathy, bilateral: Secondary | ICD-10-CM

## 2014-12-26 DIAGNOSIS — I10 Essential (primary) hypertension: Secondary | ICD-10-CM | POA: Diagnosis not present

## 2015-03-13 ENCOUNTER — Encounter (INDEPENDENT_AMBULATORY_CARE_PROVIDER_SITE_OTHER): Payer: Medicare Other | Admitting: Ophthalmology

## 2015-03-13 DIAGNOSIS — I10 Essential (primary) hypertension: Secondary | ICD-10-CM | POA: Diagnosis not present

## 2015-03-13 DIAGNOSIS — H43813 Vitreous degeneration, bilateral: Secondary | ICD-10-CM | POA: Diagnosis not present

## 2015-03-13 DIAGNOSIS — H353114 Nonexudative age-related macular degeneration, right eye, advanced atrophic with subfoveal involvement: Secondary | ICD-10-CM | POA: Diagnosis not present

## 2015-03-13 DIAGNOSIS — H35033 Hypertensive retinopathy, bilateral: Secondary | ICD-10-CM

## 2015-03-13 DIAGNOSIS — H353221 Exudative age-related macular degeneration, left eye, with active choroidal neovascularization: Secondary | ICD-10-CM | POA: Diagnosis not present

## 2015-04-05 ENCOUNTER — Other Ambulatory Visit: Payer: Self-pay

## 2015-04-05 DIAGNOSIS — M79609 Pain in unspecified limb: Secondary | ICD-10-CM

## 2015-04-05 DIAGNOSIS — R0989 Other specified symptoms and signs involving the circulatory and respiratory systems: Secondary | ICD-10-CM

## 2015-04-16 DIAGNOSIS — R609 Edema, unspecified: Secondary | ICD-10-CM | POA: Diagnosis not present

## 2015-04-16 DIAGNOSIS — E7800 Pure hypercholesterolemia, unspecified: Secondary | ICD-10-CM | POA: Diagnosis not present

## 2015-05-06 ENCOUNTER — Encounter: Payer: Self-pay | Admitting: Vascular Surgery

## 2015-05-06 DIAGNOSIS — N183 Chronic kidney disease, stage 3 (moderate): Secondary | ICD-10-CM | POA: Diagnosis not present

## 2015-05-06 DIAGNOSIS — Z6827 Body mass index (BMI) 27.0-27.9, adult: Secondary | ICD-10-CM | POA: Diagnosis not present

## 2015-05-06 DIAGNOSIS — Z789 Other specified health status: Secondary | ICD-10-CM | POA: Diagnosis not present

## 2015-05-06 DIAGNOSIS — I10 Essential (primary) hypertension: Secondary | ICD-10-CM | POA: Diagnosis not present

## 2015-05-15 ENCOUNTER — Encounter: Payer: Self-pay | Admitting: Vascular Surgery

## 2015-05-15 ENCOUNTER — Ambulatory Visit (HOSPITAL_COMMUNITY)
Admission: RE | Admit: 2015-05-15 | Discharge: 2015-05-15 | Disposition: A | Discharge status: Home / Self Care | Payer: Medicare Other | Source: Ambulatory Visit | Attending: Vascular Surgery | Admitting: Vascular Surgery

## 2015-05-15 ENCOUNTER — Ambulatory Visit (INDEPENDENT_AMBULATORY_CARE_PROVIDER_SITE_OTHER): Payer: Medicare Other | Admitting: Vascular Surgery

## 2015-05-15 VITALS — BP 183/87 | HR 85 | Temp 97.2°F | Resp 16 | Ht 66.0 in | Wt 173.0 lb

## 2015-05-15 DIAGNOSIS — R0989 Other specified symptoms and signs involving the circulatory and respiratory systems: Secondary | ICD-10-CM | POA: Insufficient documentation

## 2015-05-15 DIAGNOSIS — R938 Abnormal findings on diagnostic imaging of other specified body structures: Secondary | ICD-10-CM | POA: Diagnosis not present

## 2015-05-15 DIAGNOSIS — L03116 Cellulitis of left lower limb: Secondary | ICD-10-CM

## 2015-05-15 DIAGNOSIS — M79609 Pain in unspecified limb: Secondary | ICD-10-CM | POA: Insufficient documentation

## 2015-05-15 DIAGNOSIS — I10 Essential (primary) hypertension: Secondary | ICD-10-CM | POA: Insufficient documentation

## 2015-05-15 DIAGNOSIS — E785 Hyperlipidemia, unspecified: Secondary | ICD-10-CM | POA: Diagnosis not present

## 2015-05-15 DIAGNOSIS — I739 Peripheral vascular disease, unspecified: Secondary | ICD-10-CM

## 2015-05-15 MED ORDER — CEPHALEXIN 500 MG PO CAPS: 500.0000 mg | ORAL_CAPSULE | Freq: Three times a day (TID) | ORAL | Status: DC

## 2015-05-15 NOTE — Progress Notes (Signed)
Referring Physician: Dr. Eliott Nine History of Present Illness:  Patient is a 82 y.o. year old female who presents for evaluation of arterial blood flow with concern to left foot pain.  She states her left forefoot has been painful since 4 months ago.  She states it starts at night about 2 times a week and if she gets up and walks or rubs "[place] gel" on it the pain gets better.  She was last seen her by Dr. Scot Dock  On 01/13/2012 for left leg injury.   This patient does describe bilateral lower extremity claudication. She experiences claudication symptoms in both legs associated with ambulation and relieved with rest. Her symptoms are more significant on the left side. She experienced pain in her thighs and calves with ambulation. She denies rest pain.   She has since healed the wound without problems.  Other medical problems include has Atherosclerosis of native arteries of the extremities with ulceration(440.23) on her problem list.  Past medical history includes hypertension managed with Norvasc, peripheral neuropathy managed with gabapentin  that has recently been increased to 600 mg PO, and hyperlipidemia managed with Lipitor.    Past Medical History  Diagnosis Date  . Esophageal reflux   . Peripheral arterial disease (HCC)     Left Lateral leg ulcer  . Injury of left leg July 2013    Patient hit left leg  . Restless legs syndrome   . Lipoma of arm     Right arm  . Hyperlipidemia   . COPD (chronic obstructive pulmonary disease) ([place])   . Stroke [facility]) 2010    Right hand pain after stroke  . Cancer (HCC)     SCC: Face  . Hypertension     Past Surgical History  Procedure Laterality Date  . Tonsillectomy  1946  . Sterilization  1952  . Thyroidectomy  1968  . Abdominal hysterectomy  1981  . Biopsy breast  1986    Bx and Aspiration , Ultrasound  . Eye surgery  1999    Cataract Bilateral eyes  . Eye surgery  2000    Yag Laser Bilateral eyes  . Eye muscle surgery  2010     27  shots of Avastin Left eye  on going    Social History Social History  Substance Use Topics  . Smoking status: Never Smoker   . Smokeless tobacco: Never Used  . Alcohol Use: No    Family History Family History  Problem Relation Age of Onset  . Cancer Mother   . Heart disease Father   . Cancer Sister     Breast  . Cancer Brother   . Cancer Daughter     BCC: back and face  . Diabetes Daughter   . Other Maternal Grandmother     Cramps in legs  . Cancer Sister   . Cancer Sister   . Cancer Brother   . Cancer Brother     Allergies  Not on File   Current Outpatient Prescriptions  Medication Sig Dispense Refill  . amitriptyline (ELAVIL) 50 MG tablet     . amLODipine (NORVASC) 5 MG tablet daily.    Marland Kitchen aspirin 81 MG tablet Take 81 mg by mouth daily.    . calcium carbonate (OS-CAL) 600 MG TABS Take 600 mg by mouth daily.    . celecoxib (CELEBREX) 200 MG capsule Take 200 mg by mouth daily.    . DiphenhydrAMINE HCl (BENADRYL ALLERGY PO) Take by mouth.    Marland Kitchen  esomeprazole (NEXIUM) 40 MG capsule Take 40 mg by mouth daily before breakfast.    . fluticasone (FLONASE) 50 MCG/ACT nasal spray Place 1 spray into the nose daily.    Marland Kitchen gabapentin (NEURONTIN) 100 MG capsule Take 100 mg by mouth 3 (three) times daily.     . Ipratropium-Albuterol (COMBIVENT IN) Inhale into the lungs as needed.    Marland Kitchen ipratropium-albuterol (DUONEB) 0.5-2.5 (3) MG/3ML SOLN     . levothyroxine (SYNTHROID, LEVOTHROID) 88 MCG tablet 88 mcg daily.     Marland Kitchen lisinopril (PRINIVIL,ZESTRIL) 40 MG tablet 40 mg daily.     . meclizine (ANTIVERT) 25 MG tablet Take 25 mg by mouth daily.    . Melatonin 3 MG TABS Take 3 mg by mouth daily.    . montelukast (SINGULAIR) 10 MG tablet Take 10 mg by mouth daily.     . Multiple Vitamin (MULTIVITAMIN) tablet Take 1 tablet by mouth daily.    . Multiple Vitamins-Minerals (PRESERVISION AREDS PO) Take by mouth daily.    . polyethylene glycol powder (MIRALAX) powder Take 1 g by mouth daily.      Marland Kitchen PREVIDENT 5000 DRY MOUTH 1.1 % PSTE     . RESTASIS 0.05 % ophthalmic emulsion     . rOPINIRole (REQUIP) 1 MG tablet 1 mg at bedtime.     . simvastatin (ZOCOR) 20 MG tablet Take 20 mg by mouth daily.     . SYMBICORT 160-4.5 MCG/ACT inhaler Twice daily.    . TOPROL XL 25 MG 24 hr tablet 25 mg daily.     . traMADol (ULTRAM) 50 MG tablet Take 100 mg by mouth 3 (three) times daily.    . beta carotene w/minerals (OCUVITE) tablet Take 1 tablet by mouth daily. Reported on 05/15/2015    . estradiol (ESTRACE) 0.1 MG/GM vaginal cream Place 1 g vaginally as needed. Reported on 05/15/2015    . hydrochlorothiazide (HYDRODIURIL) 25 MG tablet Take 12.5 mg by mouth daily. Reported on 05/15/2015    . HYDROMET 5-1.5 MG/5ML syrup Reported on 05/15/2015    . hyoscyamine (CYSTOSPAZ) 0.15 MG tablet Take 0.15 mg by mouth as needed.    . metoprolol tartrate (LOPRESSOR) 25 MG tablet Take 25 mg by mouth daily. Reported on 05/15/2015     No current facility-administered medications for this visit.    ROS:   General:  No weight loss, Fever, chills  HEENT: No recent headaches, no nasal bleeding, no visual changes, no sore throat  Neurologic: No dizziness, blackouts, seizures. No recent symptoms of stroke or mini- stroke. No recent episodes of slurred speech, or temporary blindness.  Cardiac: No recent episodes of chest pain/pressure, no shortness of breath at rest.  positive shortness of breath with exertion.  Denies history of atrial fibrillation or irregular heartbeat  Vascular: No history of rest pain in feet.  positive history of claudication.  positive history of non-healing ulcer, No history of DVT   Pulmonary: No home oxygen, no productive cough, no hemoptysis,  No asthma or wheezing  Musculoskeletal:  [ ]  Arthritis, [ ]  Low back pain,  [ ]  Joint pain  Hematologic:No history of hypercoagulable state.  No history of easy bleeding.  No history of anemia  Gastrointestinal: No hematochezia or melena,  No  gastroesophageal reflux, no trouble swallowing  Urinary: [ ]  chronic Kidney disease, [ ]  on HD - [ ]  MWF or [ ]  TTHS, [ ]  Burning with urination, [ ]  Frequent urination, [ ]  Difficulty urinating;   Skin: No rashes,  left LE erythema and edema  Psychological: No history of anxiety,  No history of depression   Physical Examination  Filed Vitals:   05/15/15 1349 05/15/15 1405  BP: 191/85 183/87  Pulse: 87 85  Temp: 97.2 F (36.2 C)   TempSrc: Oral   Resp: 16   Height: 5\' 6"  (1.676 m)   Weight: 173 lb (78.472 kg)   SpO2: 96%     Body mass index is 27.94 kg/(m^2).  General:  Alert and oriented, no acute distress HEENT: Normal Neck: No bruit or JVD Pulmonary: Clear to auscultation bilaterally Cardiac: Regular Rate and Rhythm with holistic  murmur Abdomen: Soft, non-tender, non-distended, no mass, no scars Skin: Left LE edema moderate with erythema below the knee, well healed pretibial scar.  No open wounds Extremity Pulses:  2+ radial, brachial, femoral, not palpabledorsalis pedis, posterior tibial pulses bilaterally Musculoskeletal: No deformity   Neurologic: Upper and lower extremity motor 5/5 and symmetric  DATA:  ABI's Right 0.58 today, monophasic flow  Previous 0.68  Left 0.97 today with biphasic flow Previous 0.98   ASSESSMENT:   Left forefoot pain with edema and cellulitis Peripheral neuropathy Hypertension    PLAN:   Her ABI's show normal arterial flow on the left and > 50% on the right.  She does not have any open wounds and is independent with ADL's.  She is not at risk of limb loss.   We recommend elevation of LE when at rest.  She currently wears compression knee high socks and she should continue this daily. I have given her a prescription for keflex 10 days TID She has a BP cuff at home.  I recommended sh monitor her BP and call for a f/u appointment with her primary care physician.  She states she was scared today and that incrossed her BP.  She will F/U  PRN.    Theda Sers EMMA [facility] PA-C [facility] [place] Office: [phone]  The patient was seen in conjunction with Dr. Scot Dock today.  Agree with above. She has biphasic Doppler signals in the left foot with an ABI of 98%. I do not think that her pain can be attributed to peripheral vascular disease. Given her left leg swelling we have discussed the importance of intermittent leg elevation of proper positioning for this. We'll see her back as needed.  Deitra Mayo, MD, [place] [phone] Office: [phone]

## 2015-05-15 NOTE — Progress Notes (Signed)
Filed Vitals:   05/15/15 1349 05/15/15 1405  BP: 191/85 183/87  Pulse: 87 85  Temp: 97.2 F (36.2 C)   TempSrc: Oral   Resp: 16   Height: 5\' 6"  (1.676 m)   Weight: 173 lb (78.472 kg)   SpO2: 96%

## 2015-05-20 DIAGNOSIS — I10 Essential (primary) hypertension: Secondary | ICD-10-CM | POA: Diagnosis not present

## 2015-05-20 DIAGNOSIS — L03119 Cellulitis of unspecified part of limb: Secondary | ICD-10-CM | POA: Diagnosis not present

## 2015-05-20 DIAGNOSIS — Z6828 Body mass index (BMI) 28.0-28.9, adult: Secondary | ICD-10-CM | POA: Diagnosis not present

## 2015-05-20 DIAGNOSIS — N905 Atrophy of vulva: Secondary | ICD-10-CM | POA: Diagnosis not present

## 2015-05-20 DIAGNOSIS — Z789 Other specified health status: Secondary | ICD-10-CM | POA: Diagnosis not present

## 2015-05-20 DIAGNOSIS — K5901 Slow transit constipation: Secondary | ICD-10-CM | POA: Diagnosis not present

## 2015-05-20 DIAGNOSIS — R32 Unspecified urinary incontinence: Secondary | ICD-10-CM | POA: Diagnosis not present

## 2015-06-03 DIAGNOSIS — R0689 Other abnormalities of breathing: Secondary | ICD-10-CM | POA: Diagnosis not present

## 2015-06-03 DIAGNOSIS — Z87891 Personal history of nicotine dependence: Secondary | ICD-10-CM | POA: Diagnosis not present

## 2015-06-03 DIAGNOSIS — J45909 Unspecified asthma, uncomplicated: Secondary | ICD-10-CM | POA: Diagnosis not present

## 2015-06-03 DIAGNOSIS — J069 Acute upper respiratory infection, unspecified: Secondary | ICD-10-CM | POA: Diagnosis not present

## 2015-06-03 DIAGNOSIS — R05 Cough: Secondary | ICD-10-CM | POA: Diagnosis not present

## 2015-06-04 DIAGNOSIS — J449 Chronic obstructive pulmonary disease, unspecified: Secondary | ICD-10-CM | POA: Diagnosis not present

## 2015-06-04 DIAGNOSIS — I639 Cerebral infarction, unspecified: Secondary | ICD-10-CM | POA: Diagnosis not present

## 2015-06-04 DIAGNOSIS — I10 Essential (primary) hypertension: Secondary | ICD-10-CM | POA: Diagnosis not present

## 2015-06-04 DIAGNOSIS — G459 Transient cerebral ischemic attack, unspecified: Secondary | ICD-10-CM | POA: Diagnosis not present

## 2015-06-12 DIAGNOSIS — I739 Peripheral vascular disease, unspecified: Secondary | ICD-10-CM | POA: Diagnosis not present

## 2015-06-12 DIAGNOSIS — G600 Hereditary motor and sensory neuropathy: Secondary | ICD-10-CM | POA: Diagnosis not present

## 2015-06-12 DIAGNOSIS — M79671 Pain in right foot: Secondary | ICD-10-CM | POA: Diagnosis not present

## 2015-06-12 DIAGNOSIS — L110 Acquired keratosis follicularis: Secondary | ICD-10-CM | POA: Diagnosis not present

## 2015-06-18 DIAGNOSIS — I10 Essential (primary) hypertension: Secondary | ICD-10-CM | POA: Diagnosis not present

## 2015-06-18 DIAGNOSIS — J449 Chronic obstructive pulmonary disease, unspecified: Secondary | ICD-10-CM | POA: Diagnosis not present

## 2015-06-18 DIAGNOSIS — I639 Cerebral infarction, unspecified: Secondary | ICD-10-CM | POA: Diagnosis not present

## 2015-06-18 DIAGNOSIS — E7800 Pure hypercholesterolemia, unspecified: Secondary | ICD-10-CM | POA: Diagnosis not present

## 2015-06-23 DIAGNOSIS — R930 Abnormal findings on diagnostic imaging of skull and head, not elsewhere classified: Secondary | ICD-10-CM | POA: Diagnosis not present

## 2015-06-23 DIAGNOSIS — Z7951 Long term (current) use of inhaled steroids: Secondary | ICD-10-CM | POA: Diagnosis not present

## 2015-06-23 DIAGNOSIS — R911 Solitary pulmonary nodule: Secondary | ICD-10-CM | POA: Diagnosis present

## 2015-06-23 DIAGNOSIS — J45909 Unspecified asthma, uncomplicated: Secondary | ICD-10-CM | POA: Diagnosis not present

## 2015-06-23 DIAGNOSIS — J440 Chronic obstructive pulmonary disease with acute lower respiratory infection: Secondary | ICD-10-CM | POA: Diagnosis present

## 2015-06-23 DIAGNOSIS — M797 Fibromyalgia: Secondary | ICD-10-CM | POA: Diagnosis present

## 2015-06-23 DIAGNOSIS — Z85828 Personal history of other malignant neoplasm of skin: Secondary | ICD-10-CM | POA: Diagnosis not present

## 2015-06-23 DIAGNOSIS — R0602 Shortness of breath: Secondary | ICD-10-CM | POA: Diagnosis not present

## 2015-06-23 DIAGNOSIS — Z79899 Other long term (current) drug therapy: Secondary | ICD-10-CM | POA: Diagnosis not present

## 2015-06-23 DIAGNOSIS — M1990 Unspecified osteoarthritis, unspecified site: Secondary | ICD-10-CM | POA: Diagnosis present

## 2015-06-23 DIAGNOSIS — Z9981 Dependence on supplemental oxygen: Secondary | ICD-10-CM | POA: Diagnosis not present

## 2015-06-23 DIAGNOSIS — Z8673 Personal history of transient ischemic attack (TIA), and cerebral infarction without residual deficits: Secondary | ICD-10-CM | POA: Diagnosis not present

## 2015-06-23 DIAGNOSIS — Z7982 Long term (current) use of aspirin: Secondary | ICD-10-CM | POA: Diagnosis not present

## 2015-06-23 DIAGNOSIS — E039 Hypothyroidism, unspecified: Secondary | ICD-10-CM | POA: Diagnosis present

## 2015-06-23 DIAGNOSIS — I10 Essential (primary) hypertension: Secondary | ICD-10-CM | POA: Diagnosis not present

## 2015-06-23 DIAGNOSIS — R0600 Dyspnea, unspecified: Secondary | ICD-10-CM | POA: Diagnosis not present

## 2015-06-23 DIAGNOSIS — R030 Elevated blood-pressure reading, without diagnosis of hypertension: Secondary | ICD-10-CM | POA: Diagnosis not present

## 2015-06-23 DIAGNOSIS — J209 Acute bronchitis, unspecified: Secondary | ICD-10-CM | POA: Diagnosis not present

## 2015-06-23 DIAGNOSIS — R918 Other nonspecific abnormal finding of lung field: Secondary | ICD-10-CM | POA: Diagnosis not present

## 2015-06-23 DIAGNOSIS — J441 Chronic obstructive pulmonary disease with (acute) exacerbation: Secondary | ICD-10-CM | POA: Diagnosis not present

## 2015-06-23 DIAGNOSIS — M069 Rheumatoid arthritis, unspecified: Secondary | ICD-10-CM | POA: Diagnosis present

## 2015-06-25 ENCOUNTER — Ambulatory Visit: Payer: Medicare Other | Admitting: Neurology

## 2015-06-26 ENCOUNTER — Encounter: Payer: Self-pay | Admitting: Neurology

## 2015-06-26 ENCOUNTER — Telehealth: Payer: Self-pay | Admitting: *Deleted

## 2015-06-26 ENCOUNTER — Ambulatory Visit (INDEPENDENT_AMBULATORY_CARE_PROVIDER_SITE_OTHER): Payer: Medicare Other | Admitting: Ophthalmology

## 2015-06-26 ENCOUNTER — Ambulatory Visit (INDEPENDENT_AMBULATORY_CARE_PROVIDER_SITE_OTHER): Payer: Medicare Other | Admitting: Neurology

## 2015-06-26 VITALS — BP 131/66 | HR 100 | Ht 66.0 in | Wt 168.4 lb

## 2015-06-26 DIAGNOSIS — H35033 Hypertensive retinopathy, bilateral: Secondary | ICD-10-CM | POA: Diagnosis not present

## 2015-06-26 DIAGNOSIS — H353114 Nonexudative age-related macular degeneration, right eye, advanced atrophic with subfoveal involvement: Secondary | ICD-10-CM

## 2015-06-26 DIAGNOSIS — I10 Essential (primary) hypertension: Secondary | ICD-10-CM

## 2015-06-26 DIAGNOSIS — H353221 Exudative age-related macular degeneration, left eye, with active choroidal neovascularization: Secondary | ICD-10-CM

## 2015-06-26 DIAGNOSIS — Z8673 Personal history of transient ischemic attack (TIA), and cerebral infarction without residual deficits: Secondary | ICD-10-CM | POA: Diagnosis not present

## 2015-06-26 DIAGNOSIS — I6930 Unspecified sequelae of cerebral infarction: Secondary | ICD-10-CM

## 2015-06-26 DIAGNOSIS — H43813 Vitreous degeneration, bilateral: Secondary | ICD-10-CM | POA: Diagnosis not present

## 2015-06-26 NOTE — Patient Instructions (Signed)
Overall you are doing really well but I do want to suggest a few things today:   Remember to drink plenty of fluid, eat healthy meals and do not skip any meals. Try to eat protein with a every meal and eat a healthy snack such as fruit or nuts in between meals. Try to keep a regular sleep-wake schedule and try to exercise daily, particularly in the form of walking, 20-30 minutes a day, if you can.   As far as your medications are concerned, I would like to suggest: continue current medication  I would like to see you back as needed, sooner if we need to. Please call us with any interim questions, concerns, problems, updates or refill requests.   Our phone number is [phone]. We also have an after hours call service for urgent matters and there is a physician on-call for urgent questions. For any emergencies you know to call 911 or go to the nearest emergency room

## 2015-06-26 NOTE — Telephone Encounter (Signed)
Dr Jaynee Eagles- pt saw Dr Jannifer Franklin back in 2013 for f/u on stroke. Pt wanted to come back to f/u on that. Daughter will be coming w/ her. I printed off notes from centricity.   Called and spoke to daughter, Remo Lipps. She states pt has eye apt at 1215 and she is not sure how long that will take. Told her if they can check in by 300/315pm we would be able to bring them back early. She states they will try.

## 2015-06-26 NOTE — Telephone Encounter (Signed)
Tried calling home number to see if they can come at 330pm, check in 3pm instead per Dr Jaynee Eagles request.

## 2015-06-26 NOTE — Progress Notes (Signed)
[facility]    Provider:  Dr Jaynee Eagles Referring Provider: Monico Blitz, MD Primary Care Physician:  Monico Blitz, MD  CC:  Previous stroke  HPI:  [name] is a very lovely 82 y.o. female here with her daughter as a referral from Dr. Manuella Ghazi for remote stroke. Past medical history of hypertension, high cholesterol, depression, anxiety, fibromyalgia, arthritis, peripheral vascular disease,, hypothyroidism, chronic renal insufficiency, chronic obstructive lung disease on 2 L home oxygen, murmur, migraine, insomnia. She was seen in 2009 after a small left thalamic stroke and recently she has had elevated blood pressure and she's been very worried. She is here with her daughter who provides information. Patient was history of dizziness. She had left thalamic infarction in August 2009 with resultant right-sided weakness and sensory changes. Symptoms completely resolved. No symptoms. She takes aspirin every day. She recently had an ultrasound carotid doppler. She lives alone and manages her finances. No memory problems. She can't remember if there were any inciting events before her stroke, she can't remember if she was on aspirin and statin at that time.  Reviewed notes, labs and imaging from outside physicians, which showed: Reviewed [facility] documents from October 2012. MRA in 2009 showed mild intracranial atherosclerotic disease with some narrowing of the proximal vessels but no definitive stenosis of the vertebral arteries or carotids. There was some narrowing distally in the basilar artery but was otherwise within normal limits. Carotid Doppler showed 60% to 80% stenosis in the left internal carotid artery and 40-60% in the right internal carotid artery. 2-D echo in 2009 was within normal limits. Repeat carotid Doppler in July 2010 revealed 50-69% right ICA stenosis. Carotid Dopplers in April 2012 was consistent with 50-69% stenosis involving the bilateral mid internal carotid arteries. MRI  of the brain by report showed "small acute left thalamic infarct". Moderate cortical volume loss and chronic ischemic microvascular change.  Personally reviewed CT the patient from [facility] on [facility]. Reviewed with patient and daughter also reviewed MRA of the brain from 2009 patient and daughter including images. Agree with the following.   MRA 2009: 1. Moderate 50% stenosis of the basilar artery in its mid to distal one third. 2. Probable high-grade stenosis of the left posterior cerebral artery just distal to its origin. 3. Possible mild to moderate stenosis of the right middle cerebral artery proximally.  CT of the head 06/23/2015 showed no acute into cranial abnormality. Cannot visualize her small left thalamic infarct reported from 2009. Generalized cortical atrophy consistent with age with chronic ischemic microvascular changes.  Review of Systems: Patient complains of symptoms per HPI as well as the following symptoms: Fatigue, blurred vision, easy bruising, easy bleeding, shortness of breath, cough, wheezing, murmur, swelling of legs, feeling cold, increased thirst, joint pain, cramps, aching muscles, constipation, trouble swallowing, allergies, memory loss, confusion, numbness, weakness, difficulty swallowing, dizziness, insomnia, restless legs, depression, anxiety, not enough sleep, decreased energy. Pertinent negatives per HPI. All others negative.   Social History   Social History  . Marital Status: Widowed    Spouse Name: N/A  . Number of Children: 1  . Years of Education: 12   Occupational History  . Retired    Social History Main Topics  . Smoking status: Never Smoker   . Smokeless tobacco: Never Used  . Alcohol Use: No  . Drug Use: No  . Sexual Activity: Not on file   Other Topics Concern  . Not on file   Social History Narrative   Lives alone  Caffeine use: none    Family History  Problem Relation Age of Onset  . Cancer Mother   . Heart disease  Father   . Cancer Sister     Breast  . Cancer Brother   . Cancer Daughter     BCC: back and face  . Diabetes Daughter   . Other Maternal Grandmother     Cramps in legs  . Cancer Sister   . Cancer Sister   . Cancer Brother   . Cancer Brother   . Stroke Neg Hx     Past Medical History  Diagnosis Date  . Esophageal reflux   . Peripheral arterial disease (HCC)     Left Lateral leg ulcer  . Injury of left leg July 2013    Patient hit left leg  . Restless legs syndrome   . Lipoma of arm     Right arm  . Hyperlipidemia   . COPD (chronic obstructive pulmonary disease) ([place])   . Stroke [facility]) 2010    Right hand pain after stroke  . Cancer (HCC)     SCC: Face  . Hypertension     Past Surgical History  Procedure Laterality Date  . Tonsillectomy  1946  . Sterilization  1952  . Thyroidectomy  1968  . Abdominal hysterectomy  1981  . Biopsy breast  1986    Bx and Aspiration , Ultrasound  . Eye surgery  1999    Cataract Bilateral eyes  . Eye surgery  2000    Yag Laser Bilateral eyes  . Eye muscle surgery  2010     27 shots of Avastin Left eye  on going    Current Outpatient Prescriptions  Medication Sig Dispense Refill  . amitriptyline (ELAVIL) 50 MG tablet     . amLODipine (NORVASC) 5 MG tablet daily.    Marland Kitchen aspirin 81 MG tablet Take 81 mg by mouth daily.    . beta carotene w/minerals (OCUVITE) tablet Take 1 tablet by mouth daily. Reported on 05/15/2015    . calcium carbonate (OS-CAL) 600 MG TABS Take 600 mg by mouth daily.    . celecoxib (CELEBREX) 200 MG capsule Take 200 mg by mouth daily.    . cephALEXin (KEFLEX) 500 MG capsule Take 1 capsule (500 mg total) by mouth 3 (three) times daily. 30 capsule 0  . DiphenhydrAMINE HCl (BENADRYL ALLERGY PO) Take by mouth.    . esomeprazole (NEXIUM) 40 MG capsule Take 40 mg by mouth daily before breakfast.    . estradiol (ESTRACE) 0.1 MG/GM vaginal cream Place 1 g vaginally as needed. Reported on 05/15/2015    . fluticasone (FLONASE)  50 MCG/ACT nasal spray Place 1 spray into the nose daily.    Marland Kitchen gabapentin (NEURONTIN) 100 MG capsule Take 100 mg by mouth 3 (three) times daily.     . hydrochlorothiazide (HYDRODIURIL) 25 MG tablet Take 12.5 mg by mouth daily. Reported on 05/15/2015    . HYDROMET 5-1.5 MG/5ML syrup Reported on 05/15/2015    . hyoscyamine (CYSTOSPAZ) 0.15 MG tablet Take 0.15 mg by mouth as needed.    . Ipratropium-Albuterol (COMBIVENT IN) Inhale into the lungs as needed.    Marland Kitchen ipratropium-albuterol (DUONEB) 0.5-2.5 (3) MG/3ML SOLN     . levothyroxine (SYNTHROID, LEVOTHROID) 88 MCG tablet 88 mcg daily.     Marland Kitchen lisinopril (PRINIVIL,ZESTRIL) 40 MG tablet 40 mg daily.     . meclizine (ANTIVERT) 25 MG tablet Take 25 mg by mouth daily.    . Melatonin  3 MG TABS Take 3 mg by mouth daily.    . metoprolol tartrate (LOPRESSOR) 25 MG tablet Take 25 mg by mouth daily. Reported on 05/15/2015    . montelukast (SINGULAIR) 10 MG tablet Take 10 mg by mouth daily.     . Multiple Vitamin (MULTIVITAMIN) tablet Take 1 tablet by mouth daily.    . Multiple Vitamins-Minerals (PRESERVISION AREDS PO) Take by mouth daily.    . polyethylene glycol powder (MIRALAX) powder Take 1 g by mouth daily.    Marland Kitchen PREVIDENT 5000 DRY MOUTH 1.1 % PSTE     . RESTASIS 0.05 % ophthalmic emulsion     . rOPINIRole (REQUIP) 1 MG tablet 1 mg at bedtime.     . simvastatin (ZOCOR) 20 MG tablet Take 20 mg by mouth daily.     . SYMBICORT 160-4.5 MCG/ACT inhaler Twice daily.    . TOPROL XL 25 MG 24 hr tablet 25 mg daily.     . traMADol (ULTRAM) 50 MG tablet Take 100 mg by mouth 3 (three) times daily.     No current facility-administered medications for this visit.    Allergies as of 06/26/2015 - Review Complete 06/26/2015  Allergen Reaction Noted  . Valium [diazepam]  06/26/2015    Vitals: BP 131/66 mmHg  Pulse 100  Ht 5\' 6"  (1.676 m)  Wt 168 lb 6.4 oz (76.386 kg)  BMI 27.19 kg/m2 Last Weight:  Wt Readings from Last 1 Encounters:  06/26/15 168 lb 6.4 oz  (76.386 kg)   Last Height:   Ht Readings from Last 1 Encounters:  06/26/15 5\' 6"  (1.676 m)   Physical exam: Exam: Gen: NAD, conversant,                     CV: +Murmur, regular rate, no RG. No peripheral edema, warm, nontender Eyes: Conjunctivae clear without exudates or hemorrhage  Neuro: Detailed Neurologic Exam  Speech:    Speech is normal; fluent and spontaneous with normal comprehension.  Cognition:    The patient is oriented to person, place, and time;     recent and remote memory intact;     language fluent;     normal attention, concentration, fund of knowledge Cranial Nerves:    The pupils are equal, round, and reactive to light. Attempted funduscopic exam could not visualize. Visual fields are full to finger confrontation. Extraocular movements are intact. Trigeminal sensation is intact and the muscles of mastication are normal. The face is symmetric. The palate elevates in the midline. Hearing intact. Voice is normal. Shoulder shrug is normal. The tongue has normal motion without fasciculations.   Coordination:    No dysmetria  Gait:    Walks with a cane, wide-based and unsteady  Motor Observation:    No asymmetry, no atrophy, and no involuntary movements noted. Tone:    Normal muscle tone.    Posture:    Posture is normal. normal erect    Strength: Left leg prox weakness, otherwise strength is V/V in the upper and lower limbs.      Sensation: intact to LT     Reflex Exam:  DTR's:    Deep tendon reflexes in the upper and lower extremities are symmetrical bilaterally.   Toes:    The toes are downgoing bilaterally.   Clonus:    Clonus is absent.       Assessment/Plan:   very lovely 82 y.o. female here with her daughter as a referral from Dr. Manuella Ghazi for remote stroke.  Past medical history of hypertension, high cholesterol, depression, anxiety, fibromyalgia, arthritis, peripheral vascular disease,, hypothyroidism, chronic renal insufficiency, chronic  obstructive lung disease on 2 L home oxygen, murmur, migraine, insomnia. She was seen in 2009 after a small left thalamic stroke and recently she has had elevated blood pressure and she's been very worried.  I had a long d/w patient and daughter about her recent stroke, risk for recurrent stroke/TIAs, personally independently reviewed imaging studies and stroke evaluation results and answered questions.Continue aspirin and statin for secondary stroke prevention and maintain strict control of hypertension with blood pressure goal below 140/90, diabetes with hemoglobin A1c goal below 6.5% and lipids with LDL cholesterol goal below 70 mg/dL. I also advised the patient to eat a healthy diet with plenty of whole grains, cereals, fruits and vegetables, exercise as tolerated and maintain ideal body weight .Followup in the future with me if necessary.  Cc: Dr. Ignacia Bayley, [facility] [address] [place], [place] [address]  Phone [phone] Fax [phone]

## 2015-07-03 DIAGNOSIS — Z87891 Personal history of nicotine dependence: Secondary | ICD-10-CM | POA: Diagnosis not present

## 2015-07-03 DIAGNOSIS — R0689 Other abnormalities of breathing: Secondary | ICD-10-CM | POA: Diagnosis not present

## 2015-07-03 DIAGNOSIS — R0602 Shortness of breath: Secondary | ICD-10-CM | POA: Diagnosis not present

## 2015-07-03 DIAGNOSIS — J309 Allergic rhinitis, unspecified: Secondary | ICD-10-CM | POA: Diagnosis not present

## 2015-07-03 DIAGNOSIS — J45909 Unspecified asthma, uncomplicated: Secondary | ICD-10-CM | POA: Diagnosis not present

## 2015-07-04 DIAGNOSIS — J449 Chronic obstructive pulmonary disease, unspecified: Secondary | ICD-10-CM | POA: Diagnosis not present

## 2015-07-04 DIAGNOSIS — I10 Essential (primary) hypertension: Secondary | ICD-10-CM | POA: Diagnosis not present

## 2015-07-04 DIAGNOSIS — R911 Solitary pulmonary nodule: Secondary | ICD-10-CM | POA: Diagnosis not present

## 2015-07-04 DIAGNOSIS — Z299 Encounter for prophylactic measures, unspecified: Secondary | ICD-10-CM | POA: Diagnosis not present

## 2015-07-04 DIAGNOSIS — J209 Acute bronchitis, unspecified: Secondary | ICD-10-CM | POA: Diagnosis not present

## 2015-08-13 DIAGNOSIS — J309 Allergic rhinitis, unspecified: Secondary | ICD-10-CM | POA: Diagnosis not present

## 2015-08-13 DIAGNOSIS — Z87891 Personal history of nicotine dependence: Secondary | ICD-10-CM | POA: Diagnosis not present

## 2015-08-13 DIAGNOSIS — J45909 Unspecified asthma, uncomplicated: Secondary | ICD-10-CM | POA: Diagnosis not present

## 2015-08-13 DIAGNOSIS — R0689 Other abnormalities of breathing: Secondary | ICD-10-CM | POA: Diagnosis not present

## 2015-08-20 DIAGNOSIS — J449 Chronic obstructive pulmonary disease, unspecified: Secondary | ICD-10-CM | POA: Diagnosis not present

## 2015-08-20 DIAGNOSIS — I639 Cerebral infarction, unspecified: Secondary | ICD-10-CM | POA: Diagnosis not present

## 2015-08-20 DIAGNOSIS — I10 Essential (primary) hypertension: Secondary | ICD-10-CM | POA: Diagnosis not present

## 2015-08-20 DIAGNOSIS — N183 Chronic kidney disease, stage 3 (moderate): Secondary | ICD-10-CM | POA: Diagnosis not present

## 2015-08-29 DIAGNOSIS — I739 Peripheral vascular disease, unspecified: Secondary | ICD-10-CM | POA: Diagnosis not present

## 2015-08-29 DIAGNOSIS — G600 Hereditary motor and sensory neuropathy: Secondary | ICD-10-CM | POA: Diagnosis not present

## 2015-08-29 DIAGNOSIS — M79671 Pain in right foot: Secondary | ICD-10-CM | POA: Diagnosis not present

## 2015-08-29 DIAGNOSIS — L110 Acquired keratosis follicularis: Secondary | ICD-10-CM | POA: Diagnosis not present

## 2015-09-23 DIAGNOSIS — J45901 Unspecified asthma with (acute) exacerbation: Secondary | ICD-10-CM | POA: Diagnosis not present

## 2015-09-23 DIAGNOSIS — R918 Other nonspecific abnormal finding of lung field: Secondary | ICD-10-CM | POA: Diagnosis not present

## 2015-09-24 DIAGNOSIS — L03119 Cellulitis of unspecified part of limb: Secondary | ICD-10-CM | POA: Diagnosis not present

## 2015-09-24 DIAGNOSIS — I10 Essential (primary) hypertension: Secondary | ICD-10-CM | POA: Diagnosis not present

## 2015-09-24 DIAGNOSIS — J449 Chronic obstructive pulmonary disease, unspecified: Secondary | ICD-10-CM | POA: Diagnosis not present

## 2015-09-24 DIAGNOSIS — N183 Chronic kidney disease, stage 3 (moderate): Secondary | ICD-10-CM | POA: Diagnosis not present

## 2015-10-31 DIAGNOSIS — N390 Urinary tract infection, site not specified: Secondary | ICD-10-CM | POA: Diagnosis not present

## 2015-10-31 DIAGNOSIS — K469 Unspecified abdominal hernia without obstruction or gangrene: Secondary | ICD-10-CM | POA: Diagnosis not present

## 2015-10-31 DIAGNOSIS — N816 Rectocele: Secondary | ICD-10-CM | POA: Diagnosis not present

## 2015-11-04 DIAGNOSIS — N3000 Acute cystitis without hematuria: Secondary | ICD-10-CM | POA: Diagnosis not present

## 2015-11-18 DIAGNOSIS — J449 Chronic obstructive pulmonary disease, unspecified: Secondary | ICD-10-CM | POA: Diagnosis not present

## 2015-11-18 DIAGNOSIS — I10 Essential (primary) hypertension: Secondary | ICD-10-CM | POA: Diagnosis not present

## 2015-11-18 DIAGNOSIS — M810 Age-related osteoporosis without current pathological fracture: Secondary | ICD-10-CM | POA: Diagnosis not present

## 2015-11-18 DIAGNOSIS — E785 Hyperlipidemia, unspecified: Secondary | ICD-10-CM | POA: Diagnosis not present

## 2015-11-18 DIAGNOSIS — N816 Rectocele: Secondary | ICD-10-CM | POA: Diagnosis not present

## 2015-11-18 DIAGNOSIS — M797 Fibromyalgia: Secondary | ICD-10-CM | POA: Diagnosis not present

## 2015-11-19 DIAGNOSIS — M797 Fibromyalgia: Secondary | ICD-10-CM | POA: Diagnosis not present

## 2015-11-19 DIAGNOSIS — J45909 Unspecified asthma, uncomplicated: Secondary | ICD-10-CM | POA: Diagnosis not present

## 2015-11-19 DIAGNOSIS — I10 Essential (primary) hypertension: Secondary | ICD-10-CM | POA: Diagnosis not present

## 2015-11-19 DIAGNOSIS — J449 Chronic obstructive pulmonary disease, unspecified: Secondary | ICD-10-CM | POA: Diagnosis not present

## 2015-11-19 DIAGNOSIS — E785 Hyperlipidemia, unspecified: Secondary | ICD-10-CM | POA: Diagnosis not present

## 2015-11-19 DIAGNOSIS — E039 Hypothyroidism, unspecified: Secondary | ICD-10-CM | POA: Diagnosis not present

## 2015-11-19 DIAGNOSIS — M810 Age-related osteoporosis without current pathological fracture: Secondary | ICD-10-CM | POA: Diagnosis not present

## 2015-11-19 DIAGNOSIS — N816 Rectocele: Secondary | ICD-10-CM | POA: Diagnosis not present

## 2015-11-20 DIAGNOSIS — I10 Essential (primary) hypertension: Secondary | ICD-10-CM | POA: Diagnosis not present

## 2015-11-20 DIAGNOSIS — E785 Hyperlipidemia, unspecified: Secondary | ICD-10-CM | POA: Diagnosis not present

## 2015-11-20 DIAGNOSIS — M797 Fibromyalgia: Secondary | ICD-10-CM | POA: Diagnosis not present

## 2015-11-20 DIAGNOSIS — N816 Rectocele: Secondary | ICD-10-CM | POA: Diagnosis not present

## 2015-11-20 DIAGNOSIS — M810 Age-related osteoporosis without current pathological fracture: Secondary | ICD-10-CM | POA: Diagnosis not present

## 2015-11-20 DIAGNOSIS — J449 Chronic obstructive pulmonary disease, unspecified: Secondary | ICD-10-CM | POA: Diagnosis not present

## 2015-11-23 DIAGNOSIS — M25511 Pain in right shoulder: Secondary | ICD-10-CM | POA: Diagnosis not present

## 2015-11-23 DIAGNOSIS — M7501 Adhesive capsulitis of right shoulder: Secondary | ICD-10-CM | POA: Diagnosis not present

## 2015-11-23 DIAGNOSIS — E039 Hypothyroidism, unspecified: Secondary | ICD-10-CM | POA: Diagnosis not present

## 2015-11-23 DIAGNOSIS — Z79899 Other long term (current) drug therapy: Secondary | ICD-10-CM | POA: Diagnosis not present

## 2015-11-23 DIAGNOSIS — Z85828 Personal history of other malignant neoplasm of skin: Secondary | ICD-10-CM | POA: Diagnosis not present

## 2015-11-23 DIAGNOSIS — Z7982 Long term (current) use of aspirin: Secondary | ICD-10-CM | POA: Diagnosis not present

## 2015-11-23 DIAGNOSIS — Z87891 Personal history of nicotine dependence: Secondary | ICD-10-CM | POA: Diagnosis not present

## 2015-11-23 DIAGNOSIS — G2581 Restless legs syndrome: Secondary | ICD-10-CM | POA: Diagnosis not present

## 2015-11-23 DIAGNOSIS — L03116 Cellulitis of left lower limb: Secondary | ICD-10-CM | POA: Diagnosis not present

## 2015-11-23 DIAGNOSIS — M7591 Shoulder lesion, unspecified, right shoulder: Secondary | ICD-10-CM | POA: Diagnosis not present

## 2015-11-23 DIAGNOSIS — M779 Enthesopathy, unspecified: Secondary | ICD-10-CM | POA: Diagnosis not present

## 2015-11-23 DIAGNOSIS — M797 Fibromyalgia: Secondary | ICD-10-CM | POA: Diagnosis not present

## 2015-11-23 DIAGNOSIS — M069 Rheumatoid arthritis, unspecified: Secondary | ICD-10-CM | POA: Diagnosis not present

## 2015-11-23 DIAGNOSIS — Z9981 Dependence on supplemental oxygen: Secondary | ICD-10-CM | POA: Diagnosis not present

## 2015-11-23 DIAGNOSIS — L03115 Cellulitis of right lower limb: Secondary | ICD-10-CM | POA: Diagnosis not present

## 2015-11-23 DIAGNOSIS — I10 Essential (primary) hypertension: Secondary | ICD-10-CM | POA: Diagnosis not present

## 2015-11-23 DIAGNOSIS — Z7951 Long term (current) use of inhaled steroids: Secondary | ICD-10-CM | POA: Diagnosis not present

## 2015-11-23 DIAGNOSIS — K219 Gastro-esophageal reflux disease without esophagitis: Secondary | ICD-10-CM | POA: Diagnosis not present

## 2015-11-23 DIAGNOSIS — G629 Polyneuropathy, unspecified: Secondary | ICD-10-CM | POA: Diagnosis not present

## 2015-11-23 DIAGNOSIS — J45909 Unspecified asthma, uncomplicated: Secondary | ICD-10-CM | POA: Diagnosis not present

## 2015-11-28 DIAGNOSIS — M810 Age-related osteoporosis without current pathological fracture: Secondary | ICD-10-CM | POA: Diagnosis not present

## 2015-11-28 DIAGNOSIS — R109 Unspecified abdominal pain: Secondary | ICD-10-CM | POA: Diagnosis not present

## 2015-11-28 DIAGNOSIS — K623 Rectal prolapse: Secondary | ICD-10-CM | POA: Diagnosis not present

## 2015-11-28 DIAGNOSIS — J45909 Unspecified asthma, uncomplicated: Secondary | ICD-10-CM | POA: Diagnosis not present

## 2015-11-28 DIAGNOSIS — H3530 Unspecified macular degeneration: Secondary | ICD-10-CM | POA: Diagnosis not present

## 2015-11-28 DIAGNOSIS — Z79899 Other long term (current) drug therapy: Secondary | ICD-10-CM | POA: Diagnosis not present

## 2015-11-28 DIAGNOSIS — Z7982 Long term (current) use of aspirin: Secondary | ICD-10-CM | POA: Diagnosis not present

## 2015-11-28 DIAGNOSIS — R918 Other nonspecific abnormal finding of lung field: Secondary | ICD-10-CM | POA: Diagnosis not present

## 2015-11-28 DIAGNOSIS — N3001 Acute cystitis with hematuria: Secondary | ICD-10-CM | POA: Diagnosis not present

## 2015-11-28 DIAGNOSIS — R102 Pelvic and perineal pain: Secondary | ICD-10-CM | POA: Diagnosis not present

## 2015-11-28 DIAGNOSIS — R262 Difficulty in walking, not elsewhere classified: Secondary | ICD-10-CM | POA: Diagnosis not present

## 2015-11-28 DIAGNOSIS — M069 Rheumatoid arthritis, unspecified: Secondary | ICD-10-CM | POA: Diagnosis not present

## 2015-11-29 DIAGNOSIS — R102 Pelvic and perineal pain: Secondary | ICD-10-CM | POA: Diagnosis not present

## 2015-11-29 DIAGNOSIS — R262 Difficulty in walking, not elsewhere classified: Secondary | ICD-10-CM | POA: Diagnosis not present

## 2015-11-29 DIAGNOSIS — K623 Rectal prolapse: Secondary | ICD-10-CM | POA: Diagnosis not present

## 2015-11-29 DIAGNOSIS — R109 Unspecified abdominal pain: Secondary | ICD-10-CM | POA: Diagnosis not present

## 2015-12-02 DIAGNOSIS — Z9889 Other specified postprocedural states: Secondary | ICD-10-CM | POA: Diagnosis not present

## 2015-12-02 DIAGNOSIS — G2581 Restless legs syndrome: Secondary | ICD-10-CM | POA: Diagnosis not present

## 2015-12-02 DIAGNOSIS — Z87440 Personal history of urinary (tract) infections: Secondary | ICD-10-CM | POA: Diagnosis not present

## 2015-12-02 DIAGNOSIS — K6289 Other specified diseases of anus and rectum: Secondary | ICD-10-CM | POA: Diagnosis not present

## 2015-12-02 DIAGNOSIS — F329 Major depressive disorder, single episode, unspecified: Secondary | ICD-10-CM | POA: Diagnosis not present

## 2015-12-02 DIAGNOSIS — Z79899 Other long term (current) drug therapy: Secondary | ICD-10-CM | POA: Diagnosis not present

## 2015-12-02 DIAGNOSIS — M797 Fibromyalgia: Secondary | ICD-10-CM | POA: Diagnosis not present

## 2015-12-02 DIAGNOSIS — J45909 Unspecified asthma, uncomplicated: Secondary | ICD-10-CM | POA: Diagnosis not present

## 2015-12-02 DIAGNOSIS — Z7982 Long term (current) use of aspirin: Secondary | ICD-10-CM | POA: Diagnosis not present

## 2015-12-02 DIAGNOSIS — Z8673 Personal history of transient ischemic attack (TIA), and cerebral infarction without residual deficits: Secondary | ICD-10-CM | POA: Diagnosis not present

## 2015-12-02 DIAGNOSIS — Z85828 Personal history of other malignant neoplasm of skin: Secondary | ICD-10-CM | POA: Diagnosis not present

## 2015-12-02 DIAGNOSIS — E039 Hypothyroidism, unspecified: Secondary | ICD-10-CM | POA: Diagnosis not present

## 2015-12-02 DIAGNOSIS — I10 Essential (primary) hypertension: Secondary | ICD-10-CM | POA: Diagnosis not present

## 2015-12-02 DIAGNOSIS — M069 Rheumatoid arthritis, unspecified: Secondary | ICD-10-CM | POA: Diagnosis not present

## 2015-12-02 DIAGNOSIS — K219 Gastro-esophageal reflux disease without esophagitis: Secondary | ICD-10-CM | POA: Diagnosis not present

## 2015-12-04 DIAGNOSIS — R339 Retention of urine, unspecified: Secondary | ICD-10-CM | POA: Diagnosis not present

## 2015-12-05 ENCOUNTER — Emergency Department (HOSPITAL_COMMUNITY): Payer: Medicare Other

## 2015-12-05 ENCOUNTER — Encounter (HOSPITAL_COMMUNITY): Payer: Self-pay | Admitting: *Deleted

## 2015-12-05 ENCOUNTER — Emergency Department (HOSPITAL_COMMUNITY)
Admission: EM | Admit: 2015-12-05 | Discharge: 2015-12-05 | Disposition: A | Discharge status: Home / Self Care | Payer: Medicare Other | Attending: Emergency Medicine | Admitting: Emergency Medicine

## 2015-12-05 DIAGNOSIS — Z79899 Other long term (current) drug therapy: Secondary | ICD-10-CM | POA: Diagnosis not present

## 2015-12-05 DIAGNOSIS — Z7982 Long term (current) use of aspirin: Secondary | ICD-10-CM | POA: Diagnosis not present

## 2015-12-05 DIAGNOSIS — K6289 Other specified diseases of anus and rectum: Secondary | ICD-10-CM | POA: Diagnosis present

## 2015-12-05 DIAGNOSIS — K623 Rectal prolapse: Secondary | ICD-10-CM

## 2015-12-05 DIAGNOSIS — Z8673 Personal history of transient ischemic attack (TIA), and cerebral infarction without residual deficits: Secondary | ICD-10-CM | POA: Diagnosis not present

## 2015-12-05 DIAGNOSIS — R935 Abnormal findings on diagnostic imaging of other abdominal regions, including retroperitoneum: Secondary | ICD-10-CM | POA: Diagnosis not present

## 2015-12-05 DIAGNOSIS — I10 Essential (primary) hypertension: Secondary | ICD-10-CM | POA: Diagnosis not present

## 2015-12-05 DIAGNOSIS — J449 Chronic obstructive pulmonary disease, unspecified: Secondary | ICD-10-CM | POA: Diagnosis not present

## 2015-12-05 HISTORY — DX: Fibromyalgia: M79.7

## 2015-12-05 HISTORY — DX: Unspecified osteoarthritis, unspecified site: M19.90

## 2015-12-05 MED ORDER — PHENAZOPYRIDINE HCL 200 MG PO TABS: 200.0000 mg | ORAL_TABLET | Freq: Three times a day (TID) | ORAL | 0 refills | Status: DC

## 2015-12-05 MED ORDER — HYDROCORTISONE ACETATE 25 MG RE SUPP: 25.0000 mg | Freq: Two times a day (BID) | RECTAL | 0 refills | Status: AC

## 2015-12-05 NOTE — Discharge Instructions (Signed)
If your prolapse recurs, it would be appropriate for you, or family members to attempt to gently reduce.  If unable to reduce at home, contact Dr. Anthony Sar, or local emergency room.

## 2015-12-05 NOTE — ED Notes (Signed)
Rectal prolapse reduced by Dr Jeneen Rinks. Patient tolerated well.

## 2015-12-05 NOTE — ED Triage Notes (Signed)
Pt had surgery to repair a rectocele on the 8/8 with a doctor from [place]. States she has had pain since then and followed up with her doctor yesterday who told her to follow up if her pain got worse. She comes in today for that pain. She also has a catheter in place and states that is painful.

## 2015-12-05 NOTE — ED Provider Notes (Signed)
[facility] DEPT Provider Note   CSN: [id number] Arrival date & time: 12/05/15  0840  By signing my name below, I, Rayna Sexton, attest that this documentation has been prepared under the direction and in the presence of Tanna Furry, MD. Electronically Signed: Rayna Sexton, ED Scribe. 12/05/15. 9:18 AM.   History   Chief Complaint Chief Complaint  Patient presents with  . Rectal Pain    HPI HPI Comments: [name] is a 82 y.o. female who presents to the Emergency Department complaining of constant, moderate, rectal pain x 1 day.  Pt states she had a rectocele repair on 11/19/2015 at [facility] and since yesterday began experiencing her rectal pain which is caused by rectal prolapse. Pt states she has been experiencing rectal prolapse during BMs intermittently since her surgery and has had it manually reduced during routine exams since her operation. She states she is having "soft" BMs and denies being constipated. She additionally has a catheter in place and states she is producing urine normally. Pt took Tylenol #3 this morning without significant relief. She denies other associated symptoms at this time.   The history is provided by the patient and a friend. No language interpreter was used.    Past Medical History:  Diagnosis Date  . Arthritis   . Cancer (HCC)    SCC: Face  . COPD (chronic obstructive pulmonary disease) ([place])   . Esophageal reflux   . Fibromyalgia   . Hyperlipidemia   . Hypertension   . Injury of left leg July 2013   Patient hit left leg  . Lipoma of arm    Right arm  . Peripheral arterial disease (HCC)    Left Lateral leg ulcer  . Restless legs syndrome   . Stroke [facility]) 2010   Right hand pain after stroke    Patient Active Problem List   Diagnosis Date Noted  . Chronic ischemic vertebrobasilar artery thalamic stroke 06/26/2015  . Atherosclerosis of native arteries of the extremities with ulceration(440.23) 01/13/2012    Past  Surgical History:  Procedure Laterality Date  . ABDOMINAL HYSTERECTOMY  1981  . BIOPSY BREAST  1986   Bx and Aspiration , Ultrasound  . EYE MUSCLE SURGERY  2010    27 shots of Avastin Left eye  on going  . EYE SURGERY  1999   Cataract Bilateral eyes  . EYE SURGERY  2000   Yag Laser Bilateral eyes  . RECTOCELE REPAIR    . STERILIZATION  1952  . THYROIDECTOMY  1968  . TONSILLECTOMY  1946    OB History    No data available       Home Medications    Prior to Admission medications   Medication Sig Start Date End Date Taking? Authorizing Provider  amitriptyline (ELAVIL) 50 MG tablet Take 50 mg by mouth at bedtime.  10/31/11  Yes Historical Provider, MD  amLODipine (NORVASC) 5 MG tablet Take 5 mg by mouth daily.  10/19/11  Yes Historical Provider, MD  aspirin 81 MG tablet Take 81 mg by mouth daily.   Yes Historical Provider, MD  calcium carbonate (OS-CAL) 600 MG TABS Take 600 mg by mouth daily.   Yes Historical Provider, MD  celecoxib (CELEBREX) 200 MG capsule Take 200 mg by mouth daily.   Yes Historical Provider, MD  cloNIDine (CATAPRES) 0.3 MG tablet Take 0.3 mg by mouth 2 (two) times daily.  10/27/15  Yes Historical Provider, MD  DiphenhydrAMINE HCl (BENADRYL ALLERGY PO) Take 1 capsule by  mouth at bedtime.    Yes Historical Provider, MD  esomeprazole (NEXIUM) 40 MG capsule Take 40 mg by mouth daily before breakfast.   Yes Historical Provider, MD  estradiol (ESTRACE) 0.1 MG/GM vaginal cream Place 1 g vaginally as needed. Reported on 05/15/2015   Yes Historical Provider, MD  fluticasone (FLONASE) 50 MCG/ACT nasal spray Place 1 spray into the nose daily.   Yes Historical Provider, MD  furosemide (LASIX) 20 MG tablet Take 20 mg by mouth daily.  09/04/15  Yes Historical Provider, MD  gabapentin (NEURONTIN) 100 MG capsule Take 100 mg by mouth 3 (three) times daily.  10/19/11  Yes Historical Provider, MD  Ipratropium-Albuterol ([place]) Inhale into the lungs as needed.   Yes Historical  Provider, MD  ipratropium-albuterol (DUONEB) 0.5-2.5 (3) MG/3ML SOLN  10/21/11  Yes Historical Provider, MD  lisinopril (PRINIVIL,ZESTRIL) 40 MG tablet Take 40 mg by mouth daily.  11/23/11  Yes Historical Provider, MD  meclizine (ANTIVERT) 25 MG tablet Take 25 mg by mouth daily.   Yes Historical Provider, MD  Melatonin 3 MG TABS Take 3 mg by mouth daily.   Yes Historical Provider, MD  montelukast (SINGULAIR) 10 MG tablet Take 10 mg by mouth daily.  10/19/11  Yes Historical Provider, MD  Multiple Vitamin (MULTIVITAMIN) tablet Take 1 tablet by mouth daily.   Yes Historical Provider, MD  Multiple Vitamins-Minerals (PRESERVISION AREDS PO) Take by mouth daily.   Yes Historical Provider, MD  polyethylene glycol powder (MIRALAX) powder Take 1 g by mouth daily.   Yes Historical Provider, MD  PREVIDENT 5000 DRY MOUTH 1.1 % PSTE  10/19/11  Yes Historical Provider, MD  RESTASIS 0.05 % ophthalmic emulsion  10/19/11  Yes Historical Provider, MD  rOPINIRole (REQUIP) 1 MG tablet Take 1 mg by mouth at bedtime.  10/29/11  Yes Historical Provider, MD  simvastatin (ZOCOR) 20 MG tablet Take 20 mg by mouth daily.  11/23/11  Yes Historical Provider, MD  SYMBICORT 160-4.5 MCG/ACT inhaler Twice daily. 10/19/11  Yes Historical Provider, MD  SYNTHROID 100 MCG tablet Take 100 mcg by mouth daily before breakfast.  11/11/15  Yes Historical Provider, MD  traMADol (ULTRAM) 50 MG tablet Take 100 mg by mouth 3 (three) times daily.   Yes Historical Provider, MD    Family History Family History  Problem Relation Age of Onset  . Cancer Mother   . Heart disease Father   . Cancer Sister     Breast  . Cancer Brother   . Cancer Daughter     BCC: back and face  . Diabetes Daughter   . Other Maternal Grandmother     Cramps in legs  . Cancer Sister   . Cancer Sister   . Cancer Brother   . Cancer Brother   . Stroke Neg Hx     Social History Social History  Substance Use Topics  . Smoking status: Never Smoker  . Smokeless tobacco:  Never Used  . Alcohol use No     Allergies   Valium [diazepam]   Review of Systems Review of Systems  Constitutional: Negative for appetite change, chills, diaphoresis, fatigue and fever.  HENT: Negative for mouth sores, sore throat and trouble swallowing.   Eyes: Negative for visual disturbance.  Respiratory: Negative for cough, chest tightness, shortness of breath and wheezing.   Cardiovascular: Negative for chest pain.  Gastrointestinal: Positive for rectal pain. Negative for abdominal distention, abdominal pain, constipation, diarrhea, nausea and vomiting.  Endocrine: Negative for polydipsia, polyphagia and  polyuria.  Genitourinary: Negative for decreased urine volume, difficulty urinating, dysuria, frequency and hematuria.  Musculoskeletal: Negative for gait problem.  Skin: Negative for color change, pallor and rash.  Neurological: Negative for dizziness, syncope, light-headedness and headaches.  Hematological: Does not bruise/bleed easily.  Psychiatric/Behavioral: Negative for behavioral problems and confusion.  All other systems reviewed and are negative.  Physical Exam Updated Vital Signs BP (!) 148/53 (BP Location: Left Arm)   Pulse 93   Temp 97.9 F (36.6 C) (Oral)   Resp 20   Ht 5\' 6"  (1.676 m)   Wt 165 lb (74.8 kg)   SpO2 94%   BMI 26.63 kg/m   Physical Exam  Constitutional: She is oriented to person, place, and time. She appears well-developed and well-nourished. No distress.  HENT:  Head: Normocephalic.  Eyes: Conjunctivae are normal. Pupils are equal, round, and reactive to light. No scleral icterus.  Neck: Normal range of motion. Neck supple. No thyromegaly present.  Cardiovascular: Normal rate and regular rhythm.  Exam reveals no gallop and no friction rub.   No murmur heard. Pulmonary/Chest: Effort normal and breath sounds normal. No respiratory distress. She has no wheezes. She has no rales.  Abdominal: Soft. Bowel sounds are normal. She exhibits no  distension. There is no tenderness. There is no rebound.  Genitourinary:  Genitourinary Comments: Nursing chaperone present Foley catheter in urethra upon exam. Complete large rectal prolapse manually reduced.   Musculoskeletal: Normal range of motion.  Neurological: She is alert and oriented to person, place, and time.  Skin: Skin is warm and dry. No rash noted.  Psychiatric: She has a normal mood and affect. Her behavior is normal.   ED Treatments / Results  Labs (all labs ordered are listed, but only abnormal results are displayed) Labs Reviewed - No data to display  EKG  EKG Interpretation None       Radiology Dg Abd Acute W/chest  Result Date: 12/05/2015 CLINICAL DATA:  History of COPD. Rectal prolapse, status post repair. EXAM: DG ABDOMEN ACUTE W/ 1V CHEST COMPARISON:  11/28/2015. FINDINGS: There is no evidence of dilated bowel loops or free intraperitoneal air. No radiopaque calculi or other significant radiographic abnormality is seen. Levoconvex lumbosacral spine scoliosis and osteoarthritic changes of the spine noted. Heart size and mediastinal contours are within normal limits. Both lungs are clear. IMPRESSION: Negative abdominal radiographs.  No acute cardiopulmonary disease. Electronically Signed   By: Fidela Salisbury M.D.   On: 12/05/2015 09:55    Procedures Procedures  DIAGNOSTIC STUDIES: Oxygen Saturation is 94% on RA, adequate by my interpretation.    COORDINATION OF CARE: 9:03 AM Discussed next steps with pt. Pt verbalized understanding and is agreeable with the plan.    Medications Ordered in ED Medications - No data to display   Initial Impression / Assessment and Plan / ED Course  I have reviewed the triage vital signs and the nursing notes.  Pertinent labs & imaging results that were available during my care of the patient were reviewed by me and considered in my medical decision making (see chart for details).  Clinical Course    I personally  performed the services described in this documentation, which was scribed in my presence. The recorded information has been reviewed and is accurate.  Final Clinical Impressions(s) / ED Diagnoses   Final diagnoses:  Rectal prolapse    Prolapse easily reduced. Abdominal films do not show constipation or other abnormalities. I discussed the case with the patient's surgeon, Dr.DeMason.  No additional interventions required at this time. He stated that he had discussed with her the possibility of cervical procedure but felt she would be a poor candidate due to her age and the extent of surgical intervention required. I discussed this with the patient at length. Family member with her inquired as to whether she could attempt reduction at home should this recur. I did inform her that this would be safe to make a simple attempt with gentle pressure but is unable to easily and quickly reduce at home that she should be seen by her surgeon or local emergency room.    New Prescriptions New Prescriptions   No medications on file     Tanna Furry, MD 12/05/15 1131

## 2015-12-10 DIAGNOSIS — K623 Rectal prolapse: Secondary | ICD-10-CM | POA: Diagnosis not present

## 2015-12-24 DIAGNOSIS — Z1389 Encounter for screening for other disorder: Secondary | ICD-10-CM | POA: Diagnosis not present

## 2015-12-24 DIAGNOSIS — Z0000 Encounter for general adult medical examination without abnormal findings: Secondary | ICD-10-CM | POA: Diagnosis not present

## 2015-12-24 DIAGNOSIS — Z1211 Encounter for screening for malignant neoplasm of colon: Secondary | ICD-10-CM | POA: Diagnosis not present

## 2015-12-24 DIAGNOSIS — Z299 Encounter for prophylactic measures, unspecified: Secondary | ICD-10-CM | POA: Diagnosis not present

## 2015-12-24 DIAGNOSIS — Z7189 Other specified counseling: Secondary | ICD-10-CM | POA: Diagnosis not present

## 2015-12-30 ENCOUNTER — Ambulatory Visit (INDEPENDENT_AMBULATORY_CARE_PROVIDER_SITE_OTHER): Payer: Medicare Other | Admitting: Ophthalmology

## 2015-12-30 DIAGNOSIS — H35033 Hypertensive retinopathy, bilateral: Secondary | ICD-10-CM | POA: Diagnosis not present

## 2015-12-30 DIAGNOSIS — H353221 Exudative age-related macular degeneration, left eye, with active choroidal neovascularization: Secondary | ICD-10-CM

## 2015-12-30 DIAGNOSIS — H43813 Vitreous degeneration, bilateral: Secondary | ICD-10-CM

## 2015-12-30 DIAGNOSIS — I10 Essential (primary) hypertension: Secondary | ICD-10-CM

## 2015-12-30 DIAGNOSIS — H353114 Nonexudative age-related macular degeneration, right eye, advanced atrophic with subfoveal involvement: Secondary | ICD-10-CM

## 2016-01-01 ENCOUNTER — Encounter (HOSPITAL_COMMUNITY): Payer: Self-pay | Admitting: Emergency Medicine

## 2016-01-01 ENCOUNTER — Emergency Department (HOSPITAL_COMMUNITY)
Admission: EM | Admit: 2016-01-01 | Discharge: 2016-01-01 | Disposition: A | Discharge status: Home / Self Care | Payer: Medicare Other | Attending: Dermatology | Admitting: Dermatology

## 2016-01-01 DIAGNOSIS — I10 Essential (primary) hypertension: Secondary | ICD-10-CM | POA: Diagnosis not present

## 2016-01-01 DIAGNOSIS — Z7982 Long term (current) use of aspirin: Secondary | ICD-10-CM | POA: Diagnosis not present

## 2016-01-01 DIAGNOSIS — R062 Wheezing: Secondary | ICD-10-CM | POA: Diagnosis not present

## 2016-01-01 DIAGNOSIS — K623 Rectal prolapse: Secondary | ICD-10-CM | POA: Diagnosis not present

## 2016-01-01 DIAGNOSIS — Z5321 Procedure and treatment not carried out due to patient leaving prior to being seen by health care provider: Secondary | ICD-10-CM | POA: Diagnosis not present

## 2016-01-01 DIAGNOSIS — J449 Chronic obstructive pulmonary disease, unspecified: Secondary | ICD-10-CM | POA: Diagnosis not present

## 2016-01-01 DIAGNOSIS — R0789 Other chest pain: Secondary | ICD-10-CM | POA: Diagnosis not present

## 2016-01-01 DIAGNOSIS — Z79899 Other long term (current) drug therapy: Secondary | ICD-10-CM | POA: Diagnosis not present

## 2016-01-01 DIAGNOSIS — R0602 Shortness of breath: Secondary | ICD-10-CM | POA: Diagnosis not present

## 2016-01-01 DIAGNOSIS — Z8673 Personal history of transient ischemic attack (TIA), and cerebral infarction without residual deficits: Secondary | ICD-10-CM | POA: Diagnosis not present

## 2016-01-01 DIAGNOSIS — J441 Chronic obstructive pulmonary disease with (acute) exacerbation: Secondary | ICD-10-CM | POA: Diagnosis not present

## 2016-01-01 NOTE — ED Triage Notes (Signed)
Pt states with bowel movement today rectal prolapse. Pt states this has happened before

## 2016-01-23 DIAGNOSIS — E2839 Other primary ovarian failure: Secondary | ICD-10-CM | POA: Diagnosis not present

## 2016-01-28 ENCOUNTER — Encounter (HOSPITAL_COMMUNITY): Payer: Self-pay | Admitting: Emergency Medicine

## 2016-01-28 ENCOUNTER — Emergency Department (HOSPITAL_COMMUNITY)
Admission: EM | Admit: 2016-01-28 | Discharge: 2016-01-28 | Disposition: A | Discharge status: Home / Self Care | Payer: Medicare Other | Attending: Emergency Medicine | Admitting: Emergency Medicine

## 2016-01-28 DIAGNOSIS — Z79899 Other long term (current) drug therapy: Secondary | ICD-10-CM | POA: Diagnosis not present

## 2016-01-28 DIAGNOSIS — K623 Rectal prolapse: Secondary | ICD-10-CM | POA: Insufficient documentation

## 2016-01-28 DIAGNOSIS — J449 Chronic obstructive pulmonary disease, unspecified: Secondary | ICD-10-CM | POA: Diagnosis not present

## 2016-01-28 DIAGNOSIS — I10 Essential (primary) hypertension: Secondary | ICD-10-CM | POA: Diagnosis not present

## 2016-01-28 DIAGNOSIS — Z7982 Long term (current) use of aspirin: Secondary | ICD-10-CM | POA: Insufficient documentation

## 2016-01-28 NOTE — ED Provider Notes (Signed)
[facility] DEPT Provider Note   CSN: [id number] Arrival date & time: 01/28/16  1404     History   Chief Complaint Chief Complaint  Patient presents with  . Rectal Prolapse    HPI [name] is a 82 y.o. female.  HPI Patient feels that she had intermittent rectal prolapse of the past several days.  Her stools are soft.  She has a lung standing history of rectal prolapse.  Currently her surgeon is treating this conservatively as she started at 1 surgical procedure and thinks that she could benefit from another surgical procedure but he is concerned about her advanced age and her risk from anesthesia and surgery itself.  No fevers or chills.  Denies nausea vomiting.  No abdominal pain.   Past Medical History:  Diagnosis Date  . Arthritis   . Cancer (HCC)    SCC: Face  . COPD (chronic obstructive pulmonary disease) ([place])   . Esophageal reflux   . Fibromyalgia   . Hyperlipidemia   . Hypertension   . Injury of left leg July 2013   Patient hit left leg  . Lipoma of arm    Right arm  . Peripheral arterial disease (HCC)    Left Lateral leg ulcer  . Restless legs syndrome   . Stroke [facility]) 2010   Right hand pain after stroke    Patient Active Problem List   Diagnosis Date Noted  . Chronic ischemic vertebrobasilar artery thalamic stroke 06/26/2015  . Atherosclerosis of native arteries of the extremities with ulceration(440.23) 01/13/2012    Past Surgical History:  Procedure Laterality Date  . ABDOMINAL HYSTERECTOMY  1981  . BIOPSY BREAST  1986   Bx and Aspiration , Ultrasound  . EYE MUSCLE SURGERY  2010    27 shots of Avastin Left eye  on going  . EYE SURGERY  1999   Cataract Bilateral eyes  . EYE SURGERY  2000   Yag Laser Bilateral eyes  . RECTOCELE REPAIR    . STERILIZATION  1952  . THYROIDECTOMY  1968  . TONSILLECTOMY  1946    OB History    Gravida Para Term Preterm AB Living   3 1 1   2      SAB TAB Ectopic Multiple Live Births   2                Home Medications    Prior to Admission medications   Medication Sig Start Date End Date Taking? Authorizing Provider  amitriptyline (ELAVIL) 50 MG tablet Take 50 mg by mouth at bedtime.  10/31/11   Historical Provider, MD  amLODipine (NORVASC) 5 MG tablet Take 5 mg by mouth daily.  10/19/11   Historical Provider, MD  aspirin 81 MG tablet Take 81 mg by mouth daily.    Historical Provider, MD  calcium carbonate (OS-CAL) 600 MG TABS Take 600 mg by mouth daily.    Historical Provider, MD  celecoxib (CELEBREX) 200 MG capsule Take 200 mg by mouth daily.    Historical Provider, MD  cloNIDine (CATAPRES) 0.3 MG tablet Take 0.3 mg by mouth 2 (two) times daily.  10/27/15   Historical Provider, MD  DiphenhydrAMINE HCl (BENADRYL ALLERGY PO) Take 1 capsule by mouth at bedtime.     Historical Provider, MD  esomeprazole (NEXIUM) 40 MG capsule Take 40 mg by mouth daily before breakfast.    Historical Provider, MD  estradiol (ESTRACE) 0.1 MG/GM vaginal cream Place 1 g vaginally as needed. Reported on 05/15/2015  Historical Provider, MD  fluticasone (FLONASE) 50 MCG/ACT nasal spray Place 1 spray into the nose daily.    Historical Provider, MD  furosemide (LASIX) 20 MG tablet Take 20 mg by mouth daily.  09/04/15   Historical Provider, MD  gabapentin (NEURONTIN) 100 MG capsule Take 100 mg by mouth 3 (three) times daily.  10/19/11   Historical Provider, MD  hydrocortisone (ANUSOL-HC) 25 MG suppository Place 1 suppository (25 mg total) rectally 2 (two) times daily. 12/05/15   Tanna Furry, MD  Ipratropium-Albuterol (COMBIVENT IN) Inhale into the lungs as needed.    Historical Provider, MD  ipratropium-albuterol (DUONEB) 0.5-2.5 (3) MG/3ML SOLN  10/21/11   Historical Provider, MD  lisinopril (PRINIVIL,ZESTRIL) 40 MG tablet Take 40 mg by mouth daily.  11/23/11   Historical Provider, MD  meclizine (ANTIVERT) 25 MG tablet Take 25 mg by mouth daily.    Historical Provider, MD  Melatonin 3 MG TABS Take 3 mg by mouth daily.     Historical Provider, MD  montelukast (SINGULAIR) 10 MG tablet Take 10 mg by mouth daily.  10/19/11   Historical Provider, MD  Multiple Vitamin (MULTIVITAMIN) tablet Take 1 tablet by mouth daily.    Historical Provider, MD  Multiple Vitamins-Minerals (PRESERVISION AREDS PO) Take by mouth daily.    Historical Provider, MD  phenazopyridine (PYRIDIUM) 200 MG tablet Take 1 tablet (200 mg total) by mouth 3 (three) times daily. 12/05/15   Tanna Furry, MD  polyethylene glycol powder [facility]) powder Take 1 g by mouth daily.    Historical Provider, MD  PREVIDENT 5000 DRY MOUTH 1.1 % PSTE  10/19/11   Historical Provider, MD  RESTASIS 0.05 % ophthalmic emulsion  10/19/11   Historical Provider, MD  rOPINIRole (REQUIP) 1 MG tablet Take 1 mg by mouth at bedtime.  10/29/11   Historical Provider, MD  simvastatin (ZOCOR) 20 MG tablet Take 20 mg by mouth daily.  11/23/11   Historical Provider, MD  SYMBICORT 160-4.5 MCG/ACT inhaler Twice daily. 10/19/11   Historical Provider, MD  SYNTHROID 100 MCG tablet Take 100 mcg by mouth daily before breakfast.  11/11/15   Historical Provider, MD  traMADol (ULTRAM) 50 MG tablet Take 100 mg by mouth 3 (three) times daily.    Historical Provider, MD    Family History Family History  Problem Relation Age of Onset  . Cancer Mother   . Heart disease Father   . Cancer Sister     Breast  . Cancer Brother   . Cancer Daughter     BCC: back and face  . Diabetes Daughter   . Other Maternal Grandmother     Cramps in legs  . Cancer Sister   . Cancer Sister   . Cancer Brother   . Cancer Brother   . Stroke Neg Hx     Social History Social History  Substance Use Topics  . Smoking status: Never Smoker  . Smokeless tobacco: Never Used  . Alcohol use No     Allergies   Valium [diazepam]   Review of Systems Review of Systems  All other systems reviewed and are negative.    Physical Exam Updated Vital Signs BP 153/55 (BP Location: Left Arm)   Pulse 101   Temp 98.2 F (36.8  C) (Oral)   Resp 19   Ht 5\' 6"  (1.676 m)   Wt 168 lb (76.2 kg)   SpO2 95%   BMI 27.12 kg/m   Physical Exam  Constitutional: She is oriented to person, place, and  time. She appears well-developed and well-nourished. No distress.  HENT:  Head: Normocephalic and atraumatic.  Eyes: EOM are normal.  Neck: Normal range of motion.  Cardiovascular: Normal rate and regular rhythm.   Pulmonary/Chest: Effort normal and breath sounds normal.  Abdominal: Soft. She exhibits no distension. There is no tenderness.  Genitourinary:  Genitourinary Comments: Normal rectal exam.  No gross blood.  No rectal prolapse noted any longer.  Musculoskeletal: Normal range of motion.  Neurological: She is alert and oriented to person, place, and time.  Skin: Skin is warm and dry.  Psychiatric: She has a normal mood and affect. Judgment normal.  Nursing note and vitals reviewed.    ED Treatments / Results  Labs (all labs ordered are listed, but only abnormal results are displayed) Labs Reviewed - No data to display  EKG  EKG Interpretation None       Radiology No results found.  Procedures Procedures (including critical care time)  Medications Ordered in ED Medications - No data to display   Initial Impression / Assessment and Plan / ED Course  I have reviewed the triage vital signs and the nursing notes.  Pertinent labs & imaging results that were available during my care of the patient were reviewed by me and considered in my medical decision making (see chart for details).  Clinical Course    Resolution of rectal prolapse prior to my evaluation.  Patient feels much better this time.  Rectal exam demonstrates no gross blood.  Brown stool encountered.  Discharge home in good condition.  Recommended ongoing stool softeners  Final Clinical Impressions(s) / ED Diagnoses   Final diagnoses:  Rectal prolapse    New Prescriptions New Prescriptions   No medications on file     Jola Schmidt, MD 01/28/16 1545

## 2016-01-28 NOTE — ED Triage Notes (Signed)
Pt reports she has a rectal prolapse that has been out for several days. Pt reports she is bleeding from the area.

## 2016-01-31 DIAGNOSIS — J441 Chronic obstructive pulmonary disease with (acute) exacerbation: Secondary | ICD-10-CM | POA: Diagnosis not present

## 2016-01-31 DIAGNOSIS — I739 Peripheral vascular disease, unspecified: Secondary | ICD-10-CM | POA: Diagnosis not present

## 2016-01-31 DIAGNOSIS — Z299 Encounter for prophylactic measures, unspecified: Secondary | ICD-10-CM | POA: Diagnosis not present

## 2016-01-31 DIAGNOSIS — H353290 Exudative age-related macular degeneration, unspecified eye, stage unspecified: Secondary | ICD-10-CM | POA: Diagnosis not present

## 2016-01-31 DIAGNOSIS — I639 Cerebral infarction, unspecified: Secondary | ICD-10-CM | POA: Diagnosis not present

## 2016-02-02 ENCOUNTER — Emergency Department (HOSPITAL_COMMUNITY)
Admission: EM | Admit: 2016-02-02 | Discharge: 2016-02-02 | Disposition: A | Discharge status: Home / Self Care | Payer: Medicare Other | Attending: Emergency Medicine | Admitting: Emergency Medicine

## 2016-02-02 ENCOUNTER — Encounter (HOSPITAL_COMMUNITY): Payer: Self-pay | Admitting: Emergency Medicine

## 2016-02-02 DIAGNOSIS — I10 Essential (primary) hypertension: Secondary | ICD-10-CM | POA: Diagnosis not present

## 2016-02-02 DIAGNOSIS — Z79899 Other long term (current) drug therapy: Secondary | ICD-10-CM | POA: Diagnosis not present

## 2016-02-02 DIAGNOSIS — K6289 Other specified diseases of anus and rectum: Secondary | ICD-10-CM | POA: Diagnosis present

## 2016-02-02 DIAGNOSIS — Z7982 Long term (current) use of aspirin: Secondary | ICD-10-CM | POA: Diagnosis not present

## 2016-02-02 DIAGNOSIS — K623 Rectal prolapse: Secondary | ICD-10-CM | POA: Diagnosis not present

## 2016-02-02 DIAGNOSIS — K644 Residual hemorrhoidal skin tags: Secondary | ICD-10-CM

## 2016-02-02 DIAGNOSIS — J449 Chronic obstructive pulmonary disease, unspecified: Secondary | ICD-10-CM | POA: Diagnosis not present

## 2016-02-02 NOTE — ED Notes (Signed)
Pt resting on bed states she is comfortable.

## 2016-02-02 NOTE — ED Notes (Signed)
Pt reports rectocele repair in September and past hx of internal hemorrhoids. States she has been in increased pain from prolapsed rectum and hemorrhoids. Bright red blood noted in stool, increased fatigue, and increased use of home O2. Pt reports she has asthma and it has been bothering her more, uses 2L at night, now uses day and night.

## 2016-02-02 NOTE — ED Notes (Signed)
Pt reports she needs to speak with MD.

## 2016-02-02 NOTE — ED Provider Notes (Signed)
[facility] DEPT Provider Note   CSN: [id number] Arrival date & time: 02/02/16  1031  By signing my name below, I, [name], attest that this documentation has been prepared under the direction and in the presence of Cynthia Dandy, MD. Electronically Signed: Reola Boyd, ED Scribe. 02/02/16. 11:47 AM.  History   Chief Complaint Chief Complaint  Patient presents with  . Rectal Pain   The history is provided by the patient and medical records. No language interpreter was used.   HPI Comments: [name] is a 82 y.o. female who presents to the Emergency Department complaining of intermittent, sharp rectal pain onset ~1 month ago, worsening over the past several days.  Pt reports that she was evaluated by her PCP recently for this issue who dx'd her w/ hemorrhoids and rectal prolapse at that time. Pt notes that she additionally has constipation at baseline which she takes Miralax for daily and it has not acutely changed recently. No other noted OTC pain medications or home remedies were tried prior to coming into the ED. Her pain is exacerbated with attempted bowel movements. Denies abdominal pain, fever, nausea, vomiting, or any other associated symptoms.   Past Medical History:  Diagnosis Date  . Arthritis   . Cancer (HCC)    SCC: Face  . COPD (chronic obstructive pulmonary disease) ([place])   . Esophageal reflux   . Fibromyalgia   . Hyperlipidemia   . Hypertension   . Injury of left leg July 2013   Patient hit left leg  . Lipoma of arm    Right arm  . Peripheral arterial disease (HCC)    Left Lateral leg ulcer  . Restless legs syndrome   . Stroke [facility]) 2010   Right hand pain after stroke   Patient Active Problem List   Diagnosis Date Noted  . Chronic ischemic vertebrobasilar artery thalamic stroke 06/26/2015  . Atherosclerosis of native arteries of the extremities with ulceration(440.23) 01/13/2012   Past Surgical History:  Procedure Laterality  Date  . ABDOMINAL HYSTERECTOMY  1981  . BIOPSY BREAST  1986   Bx and Aspiration , Ultrasound  . EYE MUSCLE SURGERY  2010    27 shots of Avastin Left eye  on going  . EYE SURGERY  1999   Cataract Bilateral eyes  . EYE SURGERY  2000   Yag Laser Bilateral eyes  . RECTOCELE REPAIR    . STERILIZATION  1952  . THYROIDECTOMY  1968  . TONSILLECTOMY  1946   OB History    Gravida Para Term Preterm AB Living   3 1 1   2      SAB TAB Ectopic Multiple Live Births   2             Home Medications    Prior to Admission medications   Medication Sig Start Date End Date Taking? Authorizing Provider  albuterol-ipratropium (COMBIVENT) 18-103 MCG/ACT inhaler Inhale 1 puff into the lungs daily as needed for wheezing or shortness of breath.   Yes Historical Provider, MD  amitriptyline (ELAVIL) 25 MG tablet Take 12.5-25 mg by mouth at bedtime.   Yes Historical Provider, MD  amLODipine (NORVASC) 10 MG tablet Take 10 mg by mouth daily.   Yes Historical Provider, MD  aspirin 81 MG tablet Take 81 mg by mouth at bedtime.    Yes Historical Provider, MD  calcium carbonate (OS-CAL) 600 MG TABS Take 600 mg by mouth daily.   Yes Historical Provider, MD  celecoxib (  CELEBREX) 200 MG capsule Take 200 mg by mouth daily.   Yes Historical Provider, MD  cloNIDine (CATAPRES) 0.3 MG tablet Take 0.3 mg by mouth 2 (two) times daily.  10/27/15  Yes Historical Provider, MD  diphenhydrAMINE (BENADRYL) 25 MG tablet Take 25 mg by mouth at bedtime.   Yes Historical Provider, MD  esomeprazole (NEXIUM) 40 MG capsule Take 40 mg by mouth daily before breakfast.   Yes Historical Provider, MD  estradiol (ESTRACE) 0.1 MG/GM vaginal cream Place 1 g vaginally as needed (irritation). Reported on 05/15/2015   Yes Historical Provider, MD  fluticasone (FLONASE) 50 MCG/ACT nasal spray Place 1 spray into the nose daily.   Yes Historical Provider, MD  furosemide (LASIX) 20 MG tablet Take 20 mg by mouth daily.  09/04/15  Yes Historical Provider, MD    gabapentin (NEURONTIN) 300 MG capsule Take 300-600 mg by mouth 2 (two) times daily. 300 mg in the morning and 600 mg at bedtime.   Yes Historical Provider, MD  hydrocortisone (ANUSOL-HC) 25 MG suppository Place 1 suppository (25 mg total) rectally 2 (two) times daily. Patient taking differently: Place 25 mg rectally 2 (two) times daily as needed for hemorrhoids.  12/05/15  Yes Tanna Furry, MD  ipratropium-albuterol (DUONEB) 0.5-2.5 (3) MG/3ML SOLN Take 3 mLs by nebulization daily as needed (shortness of breath).  10/21/11  Yes Historical Provider, MD  levofloxacin (LEVAQUIN) 500 MG tablet Take 1 tablet by mouth daily. 01/31/16  Yes Historical Provider, MD  lisinopril (PRINIVIL,ZESTRIL) 40 MG tablet Take 40 mg by mouth daily.  11/23/11  Yes Historical Provider, MD  meclizine (ANTIVERT) 25 MG tablet Take 25 mg by mouth daily as needed for dizziness.    Yes Historical Provider, MD  Melatonin 3 MG TABS Take 3 mg by mouth daily.   Yes Historical Provider, MD  montelukast (SINGULAIR) 10 MG tablet Take 10 mg by mouth at bedtime.  10/19/11  Yes Historical Provider, MD  Multiple Vitamin (MULTIVITAMIN) tablet Take 1 tablet by mouth daily.   Yes Historical Provider, MD  Multiple Vitamins-Minerals (PRESERVISION AREDS PO) Take 1 tablet by mouth 2 (two) times daily.    Yes Historical Provider, MD  polyethylene glycol powder (MIRALAX) powder Take 1 g by mouth daily.   Yes Historical Provider, MD  PREVIDENT 5000 DRY MOUTH 1.1 % PSTE Take 1 application by mouth daily.  10/19/11  Yes Historical Provider, MD  RESTASIS 0.05 % ophthalmic emulsion Place 1 drop into both eyes 2 (two) times daily.  10/19/11  Yes Historical Provider, MD  rOPINIRole (REQUIP) 1 MG tablet Take 2 mg by mouth 2 (two) times daily.  10/29/11  Yes Historical Provider, MD  simvastatin (ZOCOR) 20 MG tablet Take 20 mg by mouth daily at 6 PM.  11/23/11  Yes Historical Provider, MD  SYMBICORT 160-4.5 MCG/ACT inhaler Inhale 2 puffs into the lungs Twice daily.   10/19/11  Yes Historical Provider, MD  SYNTHROID 100 MCG tablet Take 100 mcg by mouth daily before breakfast.  11/11/15  Yes Historical Provider, MD  traMADol (ULTRAM) 50 MG tablet Take 50 mg by mouth 3 (three) times daily.    Yes Historical Provider, MD   Family History Family History  Problem Relation Age of Onset  . Cancer Mother   . Heart disease Father   . Cancer Sister     Breast  . Cancer Brother   . Cancer Daughter     BCC: back and face  . Diabetes Daughter   . Other Maternal Grandmother  Cramps in legs  . Cancer Sister   . Cancer Sister   . Cancer Brother   . Cancer Brother   . Stroke Neg Hx    Social History Social History  Substance Use Topics  . Smoking status: Never Smoker  . Smokeless tobacco: Never Used  . Alcohol use No   Allergies   Valium [diazepam]  Review of Systems Review of Systems 10/14 systems reviewed and are negative other than those stated in the HPI  Physical Exam Updated Vital Signs BP (!) 110/47 (BP Location: Left Arm)   Pulse 85   Temp 98.1 F (36.7 C) (Oral)   Resp 20   SpO2 92%   Physical Exam Physical Exam  Nursing note and vitals reviewed. Constitutional: Well developed, well nourished, non-toxic, and in no acute distress Head: Normocephalic and atraumatic.  Mouth/Throat: Oropharynx is clear and moist.  Neck: Normal range of motion. Neck supple.  Cardiovascular: Normal rate and regular rhythm.   Pulmonary/Chest: Effort normal and breath sounds normal.  Abdominal: Soft. There is no tenderness. There is no rebound and no guarding. Mild distension. Evidence of rectal prolapse with red and healthy mucosa.  Musculoskeletal: Normal range of motion.  Neurological: Alert, no facial droop, fluent speech, moves all extremities symmetrically Skin: Skin is warm and dry.  Psychiatric: Cooperative  ED Treatments / Results  DIAGNOSTIC STUDIES: Oxygen Saturation is 92% on RA, low by my interpretation.   COORDINATION OF  CARE: 11:46 AM-Discussed next steps with pt. Pt verbalized understanding and is agreeable with the plan.   Labs (all labs ordered are listed, but only abnormal results are displayed) Labs Reviewed - No data to display  EKG  EKG Interpretation None      Radiology No results found.  Procedures Procedures   Medications Ordered in ED Medications - No data to display  Initial Impression / Assessment and Plan / ED Course  I have reviewed the triage vital signs and the nursing notes.  Pertinent labs & imaging results that were available during my care of the patient were reviewed by me and considered in my medical decision making (see chart for details).  Clinical Course   History of recurrent rectal prolapse, but appears to be a long-standing issue for her. History of recent rectocele repair in September 2017 which she states has increased episodes of rectal prolapse. Evidence of rectal prolapse here today, with healthy-appearing stoma. Reduced at bedside with sugar and manual pressure. Pain resolved after reduction. Abdomen soft and benign.   she has been seen by a surgeon for this, and she states that she is not a good surgical candidate. She will continue to follow-up with her surgeon regarding this issue. Strict return and follow-up instructions reviewed. She expressed understanding of all discharge instructions and felt comfortable with the plan of care.   Final Clinical Impressions(s) / ED Diagnoses   Final diagnoses:  Rectal prolapse  External hemorrhoids   New Prescriptions New Prescriptions   No medications on file   I personally performed the services described in this documentation, which was scribed in my presence. The recorded information has been reviewed and is accurate.    Cynthia Dandy, MD 02/02/16 1250

## 2016-02-02 NOTE — Discharge Instructions (Signed)
Return for recurrent rectal prolapse, nausea or vomiting, severe abdominal pain or any other symptoms concerning to you.  Please follow-up with your surgeon regarding rectal prolapse.  You can also increase your miralax at home for stool softener to two or three times daily as needed.

## 2016-02-02 NOTE — ED Notes (Signed)
1/2/cup of granulated sugar poured over rectal prolapse per Dr. order

## 2016-02-02 NOTE — ED Triage Notes (Signed)
Patient complains of pain from hemorrhoids. Also states prolapsed rectum.

## 2016-02-04 DIAGNOSIS — N816 Rectocele: Secondary | ICD-10-CM | POA: Diagnosis not present

## 2016-02-04 DIAGNOSIS — R0600 Dyspnea, unspecified: Secondary | ICD-10-CM | POA: Diagnosis not present

## 2016-02-04 DIAGNOSIS — R102 Pelvic and perineal pain: Secondary | ICD-10-CM | POA: Diagnosis not present

## 2016-02-04 DIAGNOSIS — R109 Unspecified abdominal pain: Secondary | ICD-10-CM | POA: Diagnosis not present

## 2016-02-04 DIAGNOSIS — K922 Gastrointestinal hemorrhage, unspecified: Secondary | ICD-10-CM | POA: Diagnosis not present

## 2016-02-04 DIAGNOSIS — N186 End stage renal disease: Secondary | ICD-10-CM | POA: Diagnosis not present

## 2016-02-04 DIAGNOSIS — N390 Urinary tract infection, site not specified: Secondary | ICD-10-CM | POA: Diagnosis not present

## 2016-02-06 DIAGNOSIS — K623 Rectal prolapse: Secondary | ICD-10-CM | POA: Diagnosis not present

## 2016-02-10 DIAGNOSIS — R102 Pelvic and perineal pain: Secondary | ICD-10-CM | POA: Diagnosis not present

## 2016-02-10 DIAGNOSIS — K623 Rectal prolapse: Secondary | ICD-10-CM | POA: Diagnosis not present

## 2016-02-11 DIAGNOSIS — R102 Pelvic and perineal pain: Secondary | ICD-10-CM | POA: Diagnosis not present

## 2016-02-12 DIAGNOSIS — Z6826 Body mass index (BMI) 26.0-26.9, adult: Secondary | ICD-10-CM | POA: Diagnosis not present

## 2016-02-12 DIAGNOSIS — K623 Rectal prolapse: Secondary | ICD-10-CM | POA: Diagnosis not present

## 2016-02-12 DIAGNOSIS — R159 Full incontinence of feces: Secondary | ICD-10-CM | POA: Diagnosis not present

## 2016-02-12 DIAGNOSIS — R195 Other fecal abnormalities: Secondary | ICD-10-CM | POA: Diagnosis not present

## 2016-02-12 DIAGNOSIS — G9009 Other idiopathic peripheral autonomic neuropathy: Secondary | ICD-10-CM | POA: Diagnosis not present

## 2016-02-17 DIAGNOSIS — E7800 Pure hypercholesterolemia, unspecified: Secondary | ICD-10-CM | POA: Diagnosis not present

## 2016-02-17 DIAGNOSIS — Z299 Encounter for prophylactic measures, unspecified: Secondary | ICD-10-CM | POA: Diagnosis not present

## 2016-02-17 DIAGNOSIS — J449 Chronic obstructive pulmonary disease, unspecified: Secondary | ICD-10-CM | POA: Diagnosis not present

## 2016-02-17 DIAGNOSIS — Z6824 Body mass index (BMI) 24.0-24.9, adult: Secondary | ICD-10-CM | POA: Diagnosis not present

## 2016-02-17 DIAGNOSIS — K623 Rectal prolapse: Secondary | ICD-10-CM | POA: Diagnosis not present

## 2016-03-10 DIAGNOSIS — K625 Hemorrhage of anus and rectum: Secondary | ICD-10-CM | POA: Diagnosis not present

## 2016-03-10 DIAGNOSIS — K623 Rectal prolapse: Secondary | ICD-10-CM | POA: Diagnosis not present

## 2016-03-10 DIAGNOSIS — K6289 Other specified diseases of anus and rectum: Secondary | ICD-10-CM | POA: Diagnosis not present

## 2016-03-11 DIAGNOSIS — K5900 Constipation, unspecified: Secondary | ICD-10-CM | POA: Diagnosis not present

## 2016-03-11 DIAGNOSIS — Z87891 Personal history of nicotine dependence: Secondary | ICD-10-CM | POA: Diagnosis not present

## 2016-03-11 DIAGNOSIS — Z01810 Encounter for preprocedural cardiovascular examination: Secondary | ICD-10-CM | POA: Diagnosis not present

## 2016-03-11 DIAGNOSIS — Z833 Family history of diabetes mellitus: Secondary | ICD-10-CM | POA: Diagnosis not present

## 2016-03-11 DIAGNOSIS — J4530 Mild persistent asthma, uncomplicated: Secondary | ICD-10-CM | POA: Diagnosis present

## 2016-03-11 DIAGNOSIS — M069 Rheumatoid arthritis, unspecified: Secondary | ICD-10-CM | POA: Diagnosis present

## 2016-03-11 DIAGNOSIS — I10 Essential (primary) hypertension: Secondary | ICD-10-CM | POA: Diagnosis present

## 2016-03-11 DIAGNOSIS — Z9841 Cataract extraction status, right eye: Secondary | ICD-10-CM | POA: Diagnosis not present

## 2016-03-11 DIAGNOSIS — Z90710 Acquired absence of both cervix and uterus: Secondary | ICD-10-CM | POA: Diagnosis not present

## 2016-03-11 DIAGNOSIS — I060 Rheumatic aortic stenosis: Secondary | ICD-10-CM | POA: Diagnosis present

## 2016-03-11 DIAGNOSIS — Z9889 Other specified postprocedural states: Secondary | ICD-10-CM | POA: Diagnosis not present

## 2016-03-11 DIAGNOSIS — I519 Heart disease, unspecified: Secondary | ICD-10-CM | POA: Diagnosis not present

## 2016-03-11 DIAGNOSIS — E039 Hypothyroidism, unspecified: Secondary | ICD-10-CM | POA: Diagnosis present

## 2016-03-11 DIAGNOSIS — Q437 Persistent cloaca: Secondary | ICD-10-CM | POA: Diagnosis not present

## 2016-03-11 DIAGNOSIS — E860 Dehydration: Secondary | ICD-10-CM | POA: Diagnosis present

## 2016-03-11 DIAGNOSIS — K623 Rectal prolapse: Secondary | ICD-10-CM | POA: Diagnosis present

## 2016-03-11 DIAGNOSIS — I739 Peripheral vascular disease, unspecified: Secondary | ICD-10-CM | POA: Diagnosis present

## 2016-03-11 DIAGNOSIS — R159 Full incontinence of feces: Secondary | ICD-10-CM | POA: Diagnosis present

## 2016-03-11 DIAGNOSIS — Z9842 Cataract extraction status, left eye: Secondary | ICD-10-CM | POA: Diagnosis not present

## 2016-03-11 DIAGNOSIS — S81812A Laceration without foreign body, left lower leg, initial encounter: Secondary | ICD-10-CM | POA: Diagnosis present

## 2016-03-11 DIAGNOSIS — Z85828 Personal history of other malignant neoplasm of skin: Secondary | ICD-10-CM | POA: Diagnosis not present

## 2016-03-11 DIAGNOSIS — J4520 Mild intermittent asthma, uncomplicated: Secondary | ICD-10-CM | POA: Diagnosis not present

## 2016-03-11 DIAGNOSIS — Z809 Family history of malignant neoplasm, unspecified: Secondary | ICD-10-CM | POA: Diagnosis not present

## 2016-03-11 DIAGNOSIS — E785 Hyperlipidemia, unspecified: Secondary | ICD-10-CM | POA: Diagnosis present

## 2016-03-11 DIAGNOSIS — I350 Nonrheumatic aortic (valve) stenosis: Secondary | ICD-10-CM | POA: Diagnosis not present

## 2016-03-11 DIAGNOSIS — K567 Ileus, unspecified: Secondary | ICD-10-CM | POA: Diagnosis not present

## 2016-03-11 DIAGNOSIS — K922 Gastrointestinal hemorrhage, unspecified: Secondary | ICD-10-CM | POA: Diagnosis present

## 2016-03-11 DIAGNOSIS — Z8673 Personal history of transient ischemic attack (TIA), and cerebral infarction without residual deficits: Secondary | ICD-10-CM | POA: Diagnosis not present

## 2016-03-19 DIAGNOSIS — I739 Peripheral vascular disease, unspecified: Secondary | ICD-10-CM | POA: Diagnosis not present

## 2016-03-19 DIAGNOSIS — Z48815 Encounter for surgical aftercare following surgery on the digestive system: Secondary | ICD-10-CM | POA: Diagnosis not present

## 2016-03-19 DIAGNOSIS — J45909 Unspecified asthma, uncomplicated: Secondary | ICD-10-CM | POA: Diagnosis not present

## 2016-03-19 DIAGNOSIS — Z87891 Personal history of nicotine dependence: Secondary | ICD-10-CM | POA: Diagnosis not present

## 2016-03-19 DIAGNOSIS — Z9981 Dependence on supplemental oxygen: Secondary | ICD-10-CM | POA: Diagnosis not present

## 2016-03-19 DIAGNOSIS — E785 Hyperlipidemia, unspecified: Secondary | ICD-10-CM | POA: Diagnosis not present

## 2016-03-19 DIAGNOSIS — M6281 Muscle weakness (generalized): Secondary | ICD-10-CM | POA: Diagnosis not present

## 2016-03-19 DIAGNOSIS — Z85828 Personal history of other malignant neoplasm of skin: Secondary | ICD-10-CM | POA: Diagnosis not present

## 2016-03-19 DIAGNOSIS — I10 Essential (primary) hypertension: Secondary | ICD-10-CM | POA: Diagnosis not present

## 2016-03-19 DIAGNOSIS — Z8673 Personal history of transient ischemic attack (TIA), and cerebral infarction without residual deficits: Secondary | ICD-10-CM | POA: Diagnosis not present

## 2016-03-23 DIAGNOSIS — E785 Hyperlipidemia, unspecified: Secondary | ICD-10-CM | POA: Diagnosis not present

## 2016-03-23 DIAGNOSIS — J45909 Unspecified asthma, uncomplicated: Secondary | ICD-10-CM | POA: Diagnosis not present

## 2016-03-23 DIAGNOSIS — Z48815 Encounter for surgical aftercare following surgery on the digestive system: Secondary | ICD-10-CM | POA: Diagnosis not present

## 2016-03-23 DIAGNOSIS — I10 Essential (primary) hypertension: Secondary | ICD-10-CM | POA: Diagnosis not present

## 2016-03-23 DIAGNOSIS — I739 Peripheral vascular disease, unspecified: Secondary | ICD-10-CM | POA: Diagnosis not present

## 2016-03-23 DIAGNOSIS — M6281 Muscle weakness (generalized): Secondary | ICD-10-CM | POA: Diagnosis not present

## 2016-03-24 DIAGNOSIS — M6281 Muscle weakness (generalized): Secondary | ICD-10-CM | POA: Diagnosis not present

## 2016-03-24 DIAGNOSIS — J45909 Unspecified asthma, uncomplicated: Secondary | ICD-10-CM | POA: Diagnosis not present

## 2016-03-24 DIAGNOSIS — E785 Hyperlipidemia, unspecified: Secondary | ICD-10-CM | POA: Diagnosis not present

## 2016-03-24 DIAGNOSIS — I10 Essential (primary) hypertension: Secondary | ICD-10-CM | POA: Diagnosis not present

## 2016-03-24 DIAGNOSIS — I739 Peripheral vascular disease, unspecified: Secondary | ICD-10-CM | POA: Diagnosis not present

## 2016-03-24 DIAGNOSIS — Z48815 Encounter for surgical aftercare following surgery on the digestive system: Secondary | ICD-10-CM | POA: Diagnosis not present

## 2016-03-25 DIAGNOSIS — I739 Peripheral vascular disease, unspecified: Secondary | ICD-10-CM | POA: Diagnosis not present

## 2016-03-25 DIAGNOSIS — I10 Essential (primary) hypertension: Secondary | ICD-10-CM | POA: Diagnosis not present

## 2016-03-25 DIAGNOSIS — Z48815 Encounter for surgical aftercare following surgery on the digestive system: Secondary | ICD-10-CM | POA: Diagnosis not present

## 2016-03-25 DIAGNOSIS — M6281 Muscle weakness (generalized): Secondary | ICD-10-CM | POA: Diagnosis not present

## 2016-03-25 DIAGNOSIS — E785 Hyperlipidemia, unspecified: Secondary | ICD-10-CM | POA: Diagnosis not present

## 2016-03-25 DIAGNOSIS — J45909 Unspecified asthma, uncomplicated: Secondary | ICD-10-CM | POA: Diagnosis not present

## 2016-03-26 DIAGNOSIS — M6281 Muscle weakness (generalized): Secondary | ICD-10-CM | POA: Diagnosis not present

## 2016-03-26 DIAGNOSIS — Z48815 Encounter for surgical aftercare following surgery on the digestive system: Secondary | ICD-10-CM | POA: Diagnosis not present

## 2016-03-26 DIAGNOSIS — I10 Essential (primary) hypertension: Secondary | ICD-10-CM | POA: Diagnosis not present

## 2016-03-26 DIAGNOSIS — E785 Hyperlipidemia, unspecified: Secondary | ICD-10-CM | POA: Diagnosis not present

## 2016-03-26 DIAGNOSIS — I739 Peripheral vascular disease, unspecified: Secondary | ICD-10-CM | POA: Diagnosis not present

## 2016-03-26 DIAGNOSIS — J45909 Unspecified asthma, uncomplicated: Secondary | ICD-10-CM | POA: Diagnosis not present

## 2016-03-31 DIAGNOSIS — K623 Rectal prolapse: Secondary | ICD-10-CM | POA: Diagnosis not present

## 2016-03-31 DIAGNOSIS — K625 Hemorrhage of anus and rectum: Secondary | ICD-10-CM | POA: Diagnosis not present

## 2016-03-31 DIAGNOSIS — K6289 Other specified diseases of anus and rectum: Secondary | ICD-10-CM | POA: Diagnosis not present

## 2016-04-01 DIAGNOSIS — J45909 Unspecified asthma, uncomplicated: Secondary | ICD-10-CM | POA: Diagnosis not present

## 2016-04-01 DIAGNOSIS — I10 Essential (primary) hypertension: Secondary | ICD-10-CM | POA: Diagnosis not present

## 2016-04-01 DIAGNOSIS — I739 Peripheral vascular disease, unspecified: Secondary | ICD-10-CM | POA: Diagnosis not present

## 2016-04-01 DIAGNOSIS — E785 Hyperlipidemia, unspecified: Secondary | ICD-10-CM | POA: Diagnosis not present

## 2016-04-01 DIAGNOSIS — Z48815 Encounter for surgical aftercare following surgery on the digestive system: Secondary | ICD-10-CM | POA: Diagnosis not present

## 2016-04-01 DIAGNOSIS — M6281 Muscle weakness (generalized): Secondary | ICD-10-CM | POA: Diagnosis not present

## 2016-04-03 DIAGNOSIS — I739 Peripheral vascular disease, unspecified: Secondary | ICD-10-CM | POA: Diagnosis not present

## 2016-04-03 DIAGNOSIS — J45909 Unspecified asthma, uncomplicated: Secondary | ICD-10-CM | POA: Diagnosis not present

## 2016-04-03 DIAGNOSIS — Z48815 Encounter for surgical aftercare following surgery on the digestive system: Secondary | ICD-10-CM | POA: Diagnosis not present

## 2016-04-03 DIAGNOSIS — E785 Hyperlipidemia, unspecified: Secondary | ICD-10-CM | POA: Diagnosis not present

## 2016-04-03 DIAGNOSIS — M6281 Muscle weakness (generalized): Secondary | ICD-10-CM | POA: Diagnosis not present

## 2016-04-03 DIAGNOSIS — I10 Essential (primary) hypertension: Secondary | ICD-10-CM | POA: Diagnosis not present

## 2016-04-08 DIAGNOSIS — Z48815 Encounter for surgical aftercare following surgery on the digestive system: Secondary | ICD-10-CM | POA: Diagnosis not present

## 2016-04-08 DIAGNOSIS — J45909 Unspecified asthma, uncomplicated: Secondary | ICD-10-CM | POA: Diagnosis not present

## 2016-04-08 DIAGNOSIS — I739 Peripheral vascular disease, unspecified: Secondary | ICD-10-CM | POA: Diagnosis not present

## 2016-04-08 DIAGNOSIS — I10 Essential (primary) hypertension: Secondary | ICD-10-CM | POA: Diagnosis not present

## 2016-04-08 DIAGNOSIS — M6281 Muscle weakness (generalized): Secondary | ICD-10-CM | POA: Diagnosis not present

## 2016-04-08 DIAGNOSIS — E785 Hyperlipidemia, unspecified: Secondary | ICD-10-CM | POA: Diagnosis not present

## 2016-04-09 DIAGNOSIS — Z789 Other specified health status: Secondary | ICD-10-CM | POA: Diagnosis not present

## 2016-04-09 DIAGNOSIS — J449 Chronic obstructive pulmonary disease, unspecified: Secondary | ICD-10-CM | POA: Diagnosis not present

## 2016-04-09 DIAGNOSIS — M6281 Muscle weakness (generalized): Secondary | ICD-10-CM | POA: Diagnosis not present

## 2016-04-09 DIAGNOSIS — I10 Essential (primary) hypertension: Secondary | ICD-10-CM | POA: Diagnosis not present

## 2016-04-09 DIAGNOSIS — J069 Acute upper respiratory infection, unspecified: Secondary | ICD-10-CM | POA: Diagnosis not present

## 2016-04-09 DIAGNOSIS — J45909 Unspecified asthma, uncomplicated: Secondary | ICD-10-CM | POA: Diagnosis not present

## 2016-04-09 DIAGNOSIS — Z48815 Encounter for surgical aftercare following surgery on the digestive system: Secondary | ICD-10-CM | POA: Diagnosis not present

## 2016-04-09 DIAGNOSIS — E7800 Pure hypercholesterolemia, unspecified: Secondary | ICD-10-CM | POA: Diagnosis not present

## 2016-04-09 DIAGNOSIS — E785 Hyperlipidemia, unspecified: Secondary | ICD-10-CM | POA: Diagnosis not present

## 2016-04-09 DIAGNOSIS — Z299 Encounter for prophylactic measures, unspecified: Secondary | ICD-10-CM | POA: Diagnosis not present

## 2016-04-09 DIAGNOSIS — I739 Peripheral vascular disease, unspecified: Secondary | ICD-10-CM | POA: Diagnosis not present

## 2016-04-09 DIAGNOSIS — Z6823 Body mass index (BMI) 23.0-23.9, adult: Secondary | ICD-10-CM | POA: Diagnosis not present

## 2016-04-10 DIAGNOSIS — M6281 Muscle weakness (generalized): Secondary | ICD-10-CM | POA: Diagnosis not present

## 2016-04-10 DIAGNOSIS — I739 Peripheral vascular disease, unspecified: Secondary | ICD-10-CM | POA: Diagnosis not present

## 2016-04-10 DIAGNOSIS — J45909 Unspecified asthma, uncomplicated: Secondary | ICD-10-CM | POA: Diagnosis not present

## 2016-04-10 DIAGNOSIS — I10 Essential (primary) hypertension: Secondary | ICD-10-CM | POA: Diagnosis not present

## 2016-04-10 DIAGNOSIS — E785 Hyperlipidemia, unspecified: Secondary | ICD-10-CM | POA: Diagnosis not present

## 2016-04-10 DIAGNOSIS — Z48815 Encounter for surgical aftercare following surgery on the digestive system: Secondary | ICD-10-CM | POA: Diagnosis not present

## 2016-04-14 DIAGNOSIS — I739 Peripheral vascular disease, unspecified: Secondary | ICD-10-CM | POA: Diagnosis not present

## 2016-04-14 DIAGNOSIS — J45909 Unspecified asthma, uncomplicated: Secondary | ICD-10-CM | POA: Diagnosis not present

## 2016-04-14 DIAGNOSIS — M6281 Muscle weakness (generalized): Secondary | ICD-10-CM | POA: Diagnosis not present

## 2016-04-14 DIAGNOSIS — Z48815 Encounter for surgical aftercare following surgery on the digestive system: Secondary | ICD-10-CM | POA: Diagnosis not present

## 2016-04-14 DIAGNOSIS — E785 Hyperlipidemia, unspecified: Secondary | ICD-10-CM | POA: Diagnosis not present

## 2016-04-14 DIAGNOSIS — I10 Essential (primary) hypertension: Secondary | ICD-10-CM | POA: Diagnosis not present

## 2016-04-17 DIAGNOSIS — Z48815 Encounter for surgical aftercare following surgery on the digestive system: Secondary | ICD-10-CM | POA: Diagnosis not present

## 2016-04-17 DIAGNOSIS — E785 Hyperlipidemia, unspecified: Secondary | ICD-10-CM | POA: Diagnosis not present

## 2016-04-17 DIAGNOSIS — I739 Peripheral vascular disease, unspecified: Secondary | ICD-10-CM | POA: Diagnosis not present

## 2016-04-17 DIAGNOSIS — J45909 Unspecified asthma, uncomplicated: Secondary | ICD-10-CM | POA: Diagnosis not present

## 2016-04-17 DIAGNOSIS — I10 Essential (primary) hypertension: Secondary | ICD-10-CM | POA: Diagnosis not present

## 2016-04-17 DIAGNOSIS — M6281 Muscle weakness (generalized): Secondary | ICD-10-CM | POA: Diagnosis not present

## 2016-04-21 DIAGNOSIS — J4540 Moderate persistent asthma, uncomplicated: Secondary | ICD-10-CM | POA: Diagnosis not present

## 2016-04-21 DIAGNOSIS — Z48815 Encounter for surgical aftercare following surgery on the digestive system: Secondary | ICD-10-CM | POA: Diagnosis not present

## 2016-04-21 DIAGNOSIS — J45909 Unspecified asthma, uncomplicated: Secondary | ICD-10-CM | POA: Diagnosis not present

## 2016-04-21 DIAGNOSIS — I739 Peripheral vascular disease, unspecified: Secondary | ICD-10-CM | POA: Diagnosis not present

## 2016-04-21 DIAGNOSIS — I10 Essential (primary) hypertension: Secondary | ICD-10-CM | POA: Diagnosis not present

## 2016-04-21 DIAGNOSIS — J209 Acute bronchitis, unspecified: Secondary | ICD-10-CM | POA: Diagnosis not present

## 2016-04-21 DIAGNOSIS — M6281 Muscle weakness (generalized): Secondary | ICD-10-CM | POA: Diagnosis not present

## 2016-04-21 DIAGNOSIS — J471 Bronchiectasis with (acute) exacerbation: Secondary | ICD-10-CM | POA: Diagnosis not present

## 2016-04-21 DIAGNOSIS — E785 Hyperlipidemia, unspecified: Secondary | ICD-10-CM | POA: Diagnosis not present

## 2016-04-23 DIAGNOSIS — M6281 Muscle weakness (generalized): Secondary | ICD-10-CM | POA: Diagnosis not present

## 2016-04-23 DIAGNOSIS — J45909 Unspecified asthma, uncomplicated: Secondary | ICD-10-CM | POA: Diagnosis not present

## 2016-04-23 DIAGNOSIS — E785 Hyperlipidemia, unspecified: Secondary | ICD-10-CM | POA: Diagnosis not present

## 2016-04-23 DIAGNOSIS — Z48815 Encounter for surgical aftercare following surgery on the digestive system: Secondary | ICD-10-CM | POA: Diagnosis not present

## 2016-04-23 DIAGNOSIS — I10 Essential (primary) hypertension: Secondary | ICD-10-CM | POA: Diagnosis not present

## 2016-04-23 DIAGNOSIS — I739 Peripheral vascular disease, unspecified: Secondary | ICD-10-CM | POA: Diagnosis not present

## 2016-04-28 DIAGNOSIS — J45909 Unspecified asthma, uncomplicated: Secondary | ICD-10-CM | POA: Diagnosis not present

## 2016-04-28 DIAGNOSIS — E785 Hyperlipidemia, unspecified: Secondary | ICD-10-CM | POA: Diagnosis not present

## 2016-04-28 DIAGNOSIS — I739 Peripheral vascular disease, unspecified: Secondary | ICD-10-CM | POA: Diagnosis not present

## 2016-04-28 DIAGNOSIS — Z48815 Encounter for surgical aftercare following surgery on the digestive system: Secondary | ICD-10-CM | POA: Diagnosis not present

## 2016-04-28 DIAGNOSIS — M6281 Muscle weakness (generalized): Secondary | ICD-10-CM | POA: Diagnosis not present

## 2016-04-28 DIAGNOSIS — I10 Essential (primary) hypertension: Secondary | ICD-10-CM | POA: Diagnosis not present

## 2016-04-30 DIAGNOSIS — J471 Bronchiectasis with (acute) exacerbation: Secondary | ICD-10-CM | POA: Diagnosis not present

## 2016-05-04 DIAGNOSIS — R3915 Urgency of urination: Secondary | ICD-10-CM | POA: Diagnosis not present

## 2016-05-04 DIAGNOSIS — R351 Nocturia: Secondary | ICD-10-CM | POA: Diagnosis not present

## 2016-05-12 DIAGNOSIS — Z1231 Encounter for screening mammogram for malignant neoplasm of breast: Secondary | ICD-10-CM | POA: Diagnosis not present

## 2016-05-12 DIAGNOSIS — Z01419 Encounter for gynecological examination (general) (routine) without abnormal findings: Secondary | ICD-10-CM | POA: Diagnosis not present

## 2016-05-26 DIAGNOSIS — J479 Bronchiectasis, uncomplicated: Secondary | ICD-10-CM | POA: Diagnosis not present

## 2016-06-02 DIAGNOSIS — H353290 Exudative age-related macular degeneration, unspecified eye, stage unspecified: Secondary | ICD-10-CM | POA: Diagnosis not present

## 2016-06-02 DIAGNOSIS — Z713 Dietary counseling and surveillance: Secondary | ICD-10-CM | POA: Diagnosis not present

## 2016-06-02 DIAGNOSIS — I739 Peripheral vascular disease, unspecified: Secondary | ICD-10-CM | POA: Diagnosis not present

## 2016-06-02 DIAGNOSIS — J449 Chronic obstructive pulmonary disease, unspecified: Secondary | ICD-10-CM | POA: Diagnosis not present

## 2016-06-02 DIAGNOSIS — Z299 Encounter for prophylactic measures, unspecified: Secondary | ICD-10-CM | POA: Diagnosis not present

## 2016-06-02 DIAGNOSIS — N183 Chronic kidney disease, stage 3 (moderate): Secondary | ICD-10-CM | POA: Diagnosis not present

## 2016-06-02 DIAGNOSIS — Z6825 Body mass index (BMI) 25.0-25.9, adult: Secondary | ICD-10-CM | POA: Diagnosis not present

## 2016-06-02 DIAGNOSIS — I10 Essential (primary) hypertension: Secondary | ICD-10-CM | POA: Diagnosis not present

## 2016-06-02 DIAGNOSIS — H6690 Otitis media, unspecified, unspecified ear: Secondary | ICD-10-CM | POA: Diagnosis not present

## 2016-06-02 DIAGNOSIS — Z87891 Personal history of nicotine dependence: Secondary | ICD-10-CM | POA: Diagnosis not present

## 2016-06-02 DIAGNOSIS — I639 Cerebral infarction, unspecified: Secondary | ICD-10-CM | POA: Diagnosis not present

## 2016-06-10 DIAGNOSIS — Z299 Encounter for prophylactic measures, unspecified: Secondary | ICD-10-CM | POA: Diagnosis not present

## 2016-06-10 DIAGNOSIS — M545 Low back pain: Secondary | ICD-10-CM | POA: Diagnosis not present

## 2016-06-10 DIAGNOSIS — R011 Cardiac murmur, unspecified: Secondary | ICD-10-CM | POA: Diagnosis not present

## 2016-06-10 DIAGNOSIS — G2581 Restless legs syndrome: Secondary | ICD-10-CM | POA: Diagnosis not present

## 2016-06-10 DIAGNOSIS — Z6824 Body mass index (BMI) 24.0-24.9, adult: Secondary | ICD-10-CM | POA: Diagnosis not present

## 2016-06-10 DIAGNOSIS — Z713 Dietary counseling and surveillance: Secondary | ICD-10-CM | POA: Diagnosis not present

## 2016-06-29 ENCOUNTER — Ambulatory Visit (INDEPENDENT_AMBULATORY_CARE_PROVIDER_SITE_OTHER): Payer: Medicare Other | Admitting: Ophthalmology

## 2016-06-29 DIAGNOSIS — H353221 Exudative age-related macular degeneration, left eye, with active choroidal neovascularization: Secondary | ICD-10-CM

## 2016-06-29 DIAGNOSIS — H43813 Vitreous degeneration, bilateral: Secondary | ICD-10-CM | POA: Diagnosis not present

## 2016-06-29 DIAGNOSIS — I10 Essential (primary) hypertension: Secondary | ICD-10-CM

## 2016-06-29 DIAGNOSIS — H353114 Nonexudative age-related macular degeneration, right eye, advanced atrophic with subfoveal involvement: Secondary | ICD-10-CM | POA: Diagnosis not present

## 2016-06-29 DIAGNOSIS — H35033 Hypertensive retinopathy, bilateral: Secondary | ICD-10-CM | POA: Diagnosis not present

## 2016-06-30 DIAGNOSIS — K624 Stenosis of anus and rectum: Secondary | ICD-10-CM | POA: Diagnosis not present

## 2016-08-06 DIAGNOSIS — Z299 Encounter for prophylactic measures, unspecified: Secondary | ICD-10-CM | POA: Diagnosis not present

## 2016-08-06 DIAGNOSIS — Z6826 Body mass index (BMI) 26.0-26.9, adult: Secondary | ICD-10-CM | POA: Diagnosis not present

## 2016-08-06 DIAGNOSIS — E7800 Pure hypercholesterolemia, unspecified: Secondary | ICD-10-CM | POA: Diagnosis not present

## 2016-08-06 DIAGNOSIS — L97909 Non-pressure chronic ulcer of unspecified part of unspecified lower leg with unspecified severity: Secondary | ICD-10-CM | POA: Diagnosis not present

## 2016-08-06 DIAGNOSIS — I10 Essential (primary) hypertension: Secondary | ICD-10-CM | POA: Diagnosis not present

## 2016-08-06 DIAGNOSIS — J449 Chronic obstructive pulmonary disease, unspecified: Secondary | ICD-10-CM | POA: Diagnosis not present

## 2016-08-06 DIAGNOSIS — M1990 Unspecified osteoarthritis, unspecified site: Secondary | ICD-10-CM | POA: Diagnosis not present

## 2016-08-06 DIAGNOSIS — H353290 Exudative age-related macular degeneration, unspecified eye, stage unspecified: Secondary | ICD-10-CM | POA: Diagnosis not present

## 2016-08-06 DIAGNOSIS — G2581 Restless legs syndrome: Secondary | ICD-10-CM | POA: Diagnosis not present

## 2016-08-06 DIAGNOSIS — I639 Cerebral infarction, unspecified: Secondary | ICD-10-CM | POA: Diagnosis not present

## 2016-08-06 DIAGNOSIS — I739 Peripheral vascular disease, unspecified: Secondary | ICD-10-CM | POA: Diagnosis not present

## 2016-08-06 DIAGNOSIS — N183 Chronic kidney disease, stage 3 (moderate): Secondary | ICD-10-CM | POA: Diagnosis not present

## 2016-09-15 IMAGING — DX DG ABDOMEN ACUTE W/ 1V CHEST
3 series · 3 of 3 positions shown · non-contrast
Comparison: 11/28/2015.

CLINICAL DATA: History of COPD. Rectal prolapse, status post
repair.

EXAM:
DG ABDOMEN ACUTE W/ 1V CHEST

[chest pa]
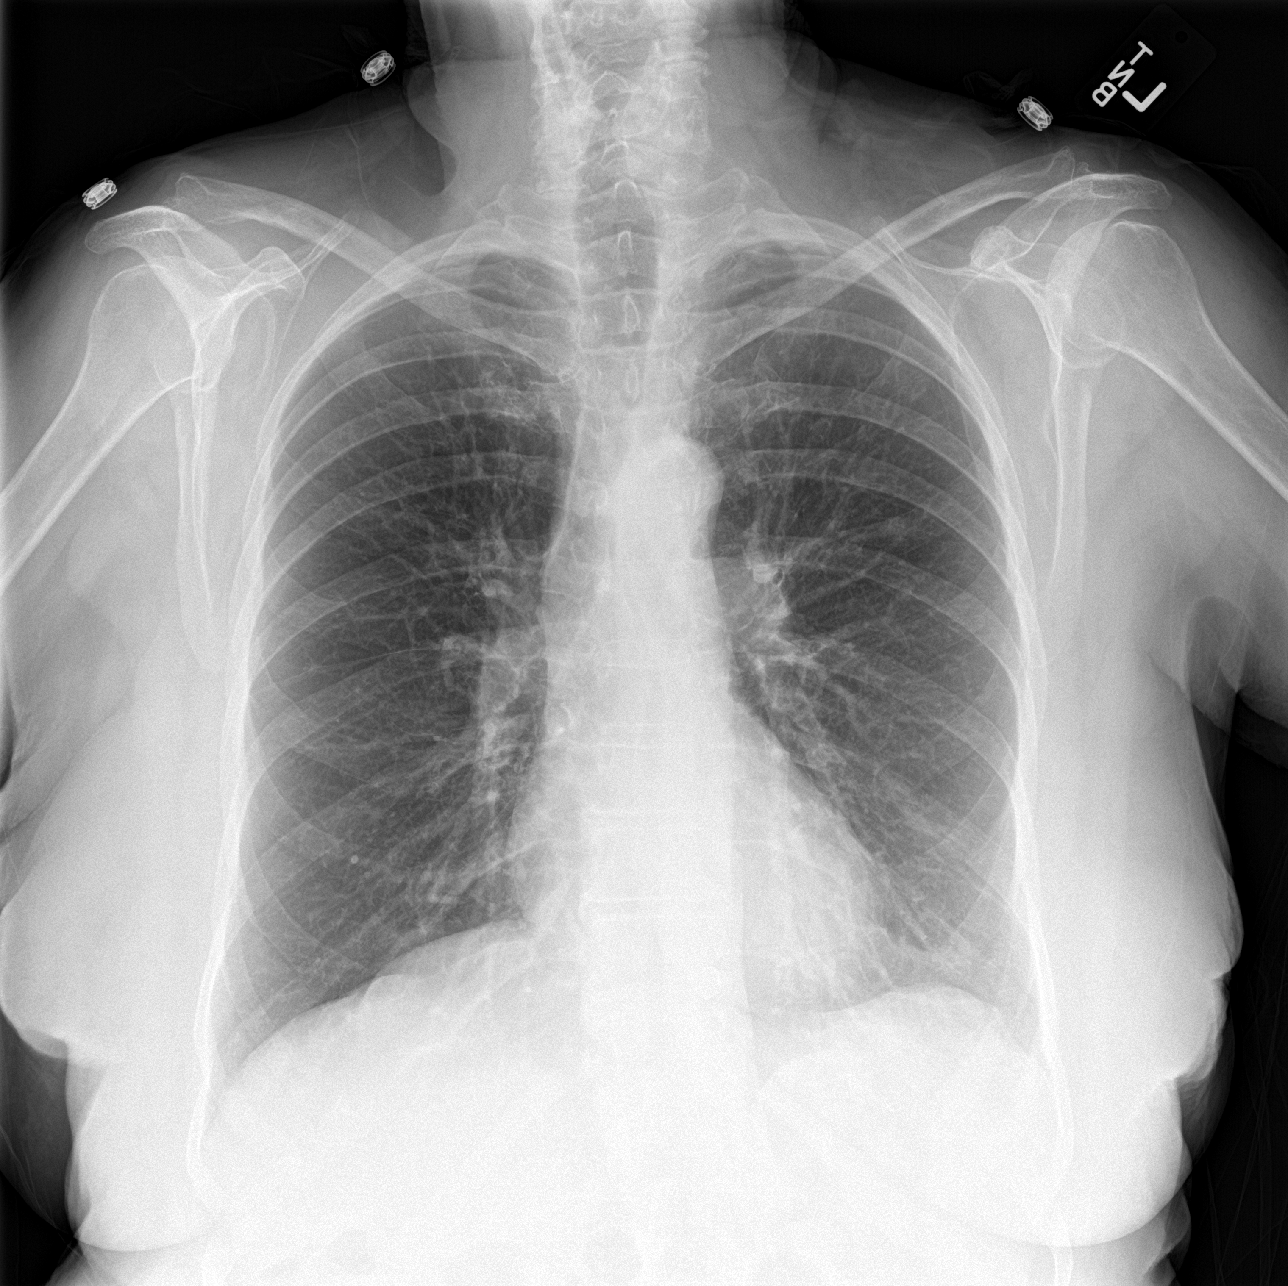

[abdomen erect]
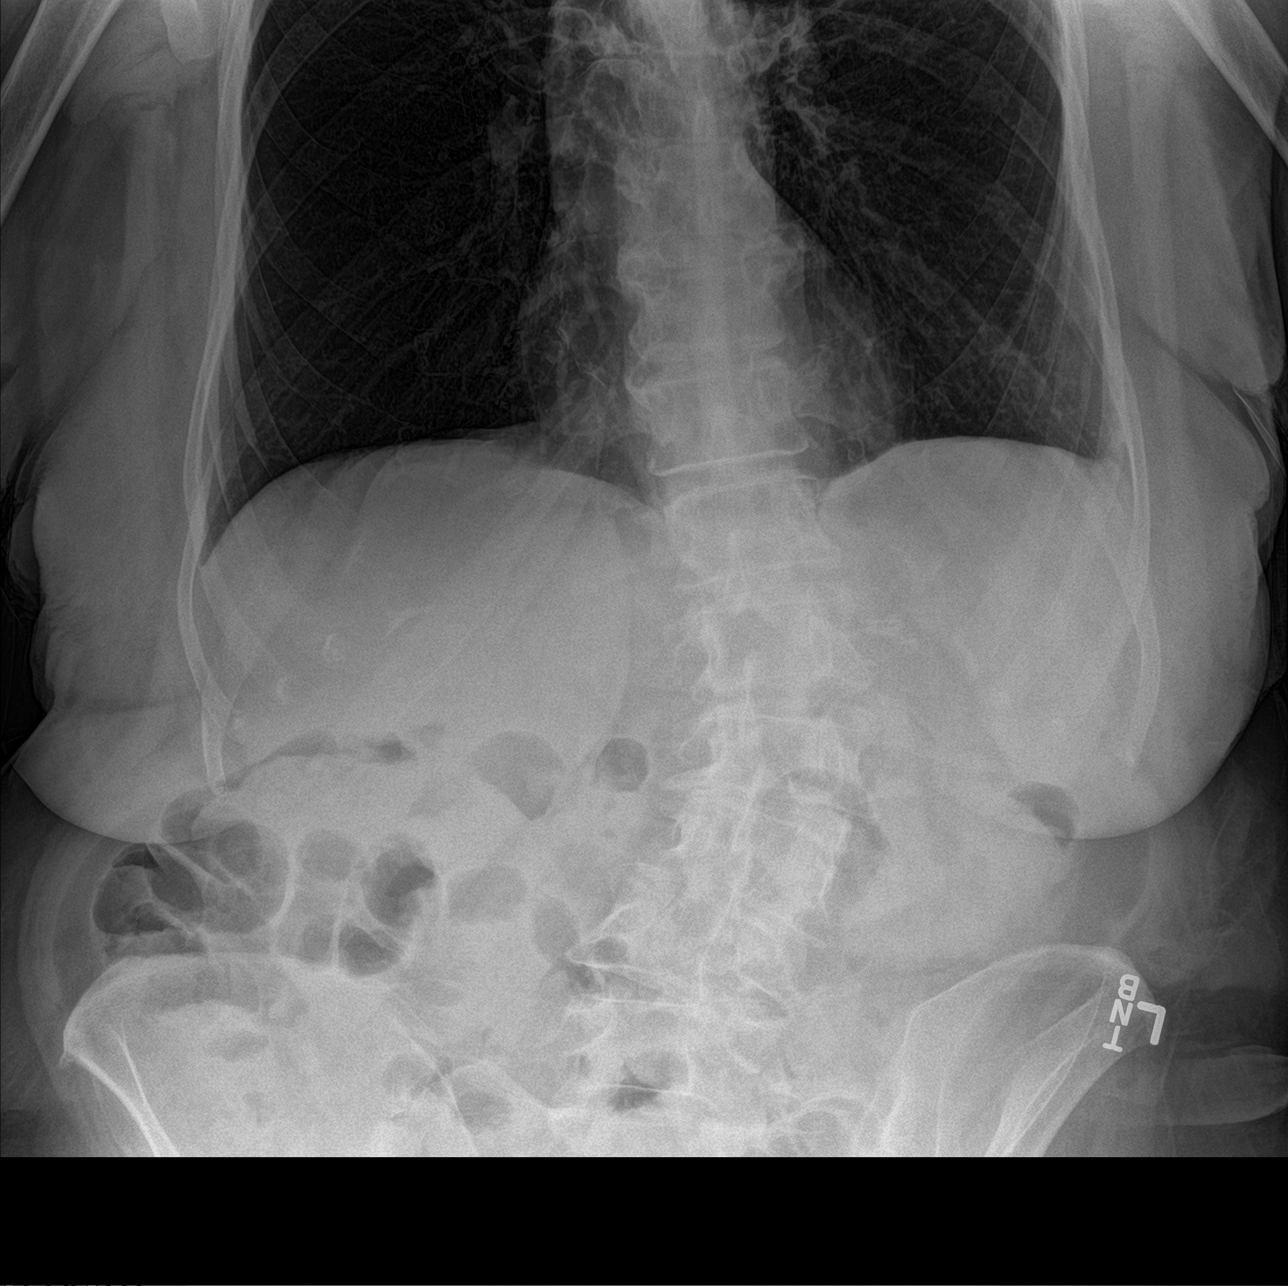

[abdomen supine]
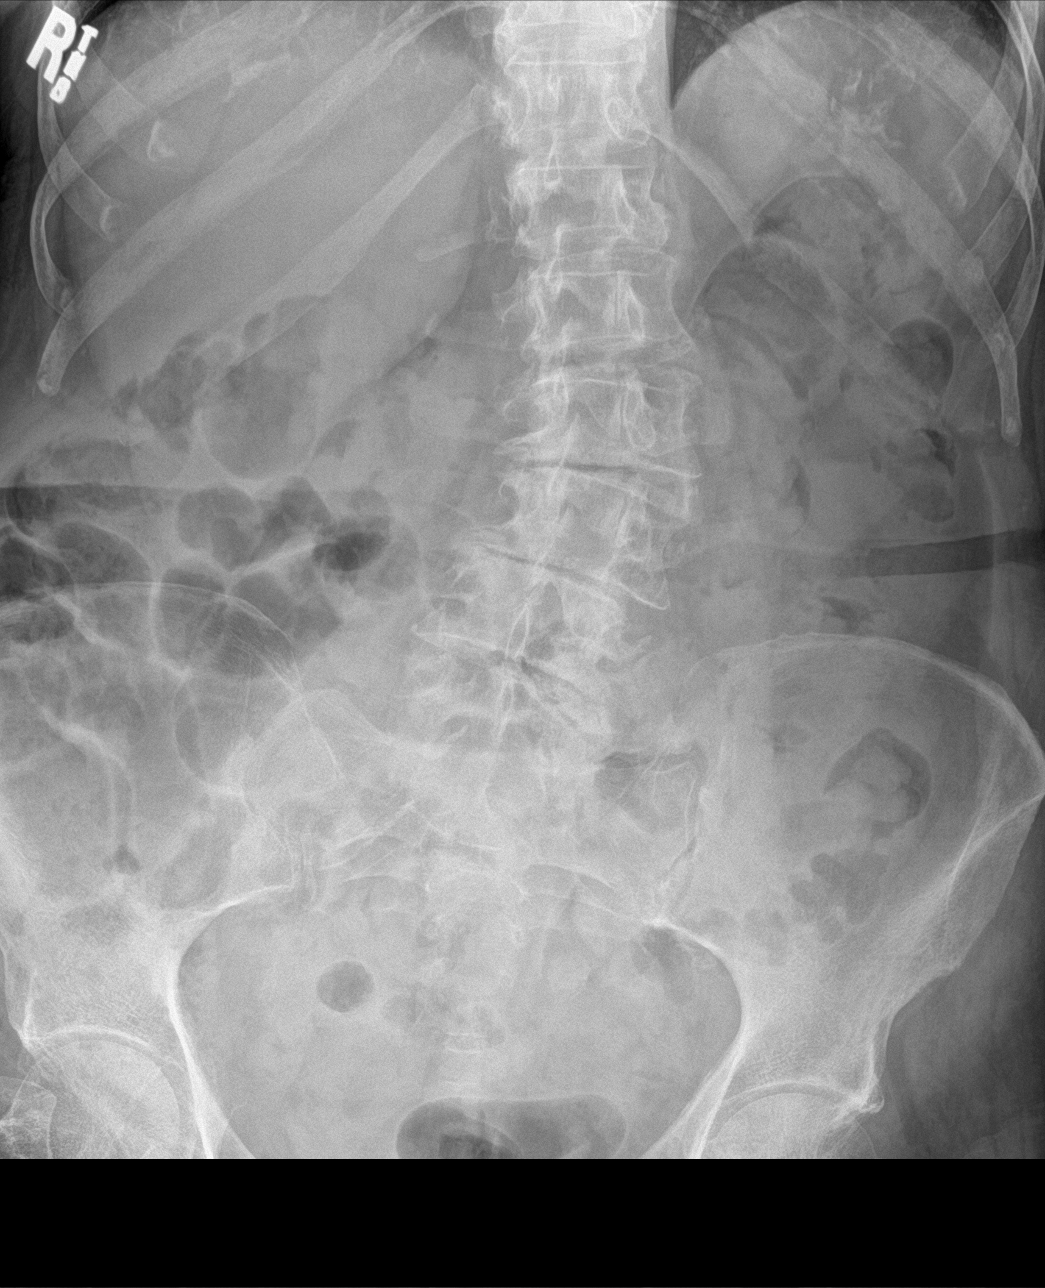

[3 of 3 positions shown; findings below may reference images not displayed]

FINDINGS: There is no evidence of dilated bowel loops or free intraperitoneal
air. No radiopaque calculi or other significant radiographic
abnormality is seen. Levoconvex lumbosacral spine scoliosis and
osteoarthritic changes of the spine noted.

Heart size and mediastinal contours are within normal limits. Both
lungs are clear.
IMPRESSION: Negative abdominal radiographs.  No acute cardiopulmonary disease.

## 2016-10-22 DIAGNOSIS — E039 Hypothyroidism, unspecified: Secondary | ICD-10-CM | POA: Diagnosis not present

## 2016-10-22 DIAGNOSIS — E7800 Pure hypercholesterolemia, unspecified: Secondary | ICD-10-CM | POA: Diagnosis not present

## 2016-10-22 DIAGNOSIS — H353290 Exudative age-related macular degeneration, unspecified eye, stage unspecified: Secondary | ICD-10-CM | POA: Diagnosis not present

## 2016-10-22 DIAGNOSIS — I10 Essential (primary) hypertension: Secondary | ICD-10-CM | POA: Diagnosis not present

## 2016-10-22 DIAGNOSIS — I739 Peripheral vascular disease, unspecified: Secondary | ICD-10-CM | POA: Diagnosis not present

## 2016-10-22 DIAGNOSIS — N183 Chronic kidney disease, stage 3 (moderate): Secondary | ICD-10-CM | POA: Diagnosis not present

## 2016-10-22 DIAGNOSIS — J449 Chronic obstructive pulmonary disease, unspecified: Secondary | ICD-10-CM | POA: Diagnosis not present

## 2016-10-22 DIAGNOSIS — Z299 Encounter for prophylactic measures, unspecified: Secondary | ICD-10-CM | POA: Diagnosis not present

## 2016-10-22 DIAGNOSIS — Z6826 Body mass index (BMI) 26.0-26.9, adult: Secondary | ICD-10-CM | POA: Diagnosis not present

## 2016-12-02 DIAGNOSIS — J479 Bronchiectasis, uncomplicated: Secondary | ICD-10-CM | POA: Diagnosis not present

## 2016-12-29 DIAGNOSIS — N183 Chronic kidney disease, stage 3 (moderate): Secondary | ICD-10-CM | POA: Diagnosis not present

## 2016-12-29 DIAGNOSIS — E559 Vitamin D deficiency, unspecified: Secondary | ICD-10-CM | POA: Diagnosis not present

## 2016-12-29 DIAGNOSIS — Z1389 Encounter for screening for other disorder: Secondary | ICD-10-CM | POA: Diagnosis not present

## 2016-12-29 DIAGNOSIS — Z6826 Body mass index (BMI) 26.0-26.9, adult: Secondary | ICD-10-CM | POA: Diagnosis not present

## 2016-12-29 DIAGNOSIS — I739 Peripheral vascular disease, unspecified: Secondary | ICD-10-CM | POA: Diagnosis not present

## 2016-12-29 DIAGNOSIS — Z299 Encounter for prophylactic measures, unspecified: Secondary | ICD-10-CM | POA: Diagnosis not present

## 2016-12-29 DIAGNOSIS — Z0000 Encounter for general adult medical examination without abnormal findings: Secondary | ICD-10-CM | POA: Diagnosis not present

## 2016-12-29 DIAGNOSIS — I639 Cerebral infarction, unspecified: Secondary | ICD-10-CM | POA: Diagnosis not present

## 2016-12-29 DIAGNOSIS — R5383 Other fatigue: Secondary | ICD-10-CM | POA: Diagnosis not present

## 2016-12-29 DIAGNOSIS — E039 Hypothyroidism, unspecified: Secondary | ICD-10-CM | POA: Diagnosis not present

## 2016-12-29 DIAGNOSIS — E7800 Pure hypercholesterolemia, unspecified: Secondary | ICD-10-CM | POA: Diagnosis not present

## 2016-12-29 DIAGNOSIS — Z1211 Encounter for screening for malignant neoplasm of colon: Secondary | ICD-10-CM | POA: Diagnosis not present

## 2016-12-29 DIAGNOSIS — Z79899 Other long term (current) drug therapy: Secondary | ICD-10-CM | POA: Diagnosis not present

## 2016-12-29 DIAGNOSIS — Z7189 Other specified counseling: Secondary | ICD-10-CM | POA: Diagnosis not present

## 2016-12-31 ENCOUNTER — Ambulatory Visit (INDEPENDENT_AMBULATORY_CARE_PROVIDER_SITE_OTHER): Payer: Medicare Other | Admitting: Ophthalmology

## 2016-12-31 DIAGNOSIS — H353114 Nonexudative age-related macular degeneration, right eye, advanced atrophic with subfoveal involvement: Secondary | ICD-10-CM

## 2016-12-31 DIAGNOSIS — H353221 Exudative age-related macular degeneration, left eye, with active choroidal neovascularization: Secondary | ICD-10-CM

## 2016-12-31 DIAGNOSIS — H35033 Hypertensive retinopathy, bilateral: Secondary | ICD-10-CM | POA: Diagnosis not present

## 2016-12-31 DIAGNOSIS — H43813 Vitreous degeneration, bilateral: Secondary | ICD-10-CM | POA: Diagnosis not present

## 2016-12-31 DIAGNOSIS — I10 Essential (primary) hypertension: Secondary | ICD-10-CM | POA: Diagnosis not present

## 2017-01-21 DIAGNOSIS — Z23 Encounter for immunization: Secondary | ICD-10-CM | POA: Diagnosis not present

## 2017-02-09 DIAGNOSIS — Z23 Encounter for immunization: Secondary | ICD-10-CM | POA: Diagnosis not present

## 2017-02-09 DIAGNOSIS — H353110 Nonexudative age-related macular degeneration, right eye, stage unspecified: Secondary | ICD-10-CM | POA: Diagnosis not present

## 2017-02-23 DIAGNOSIS — N189 Chronic kidney disease, unspecified: Secondary | ICD-10-CM | POA: Diagnosis not present

## 2017-02-23 DIAGNOSIS — M545 Low back pain: Secondary | ICD-10-CM | POA: Diagnosis not present

## 2017-02-23 DIAGNOSIS — Z299 Encounter for prophylactic measures, unspecified: Secondary | ICD-10-CM | POA: Diagnosis not present

## 2017-02-23 DIAGNOSIS — N390 Urinary tract infection, site not specified: Secondary | ICD-10-CM | POA: Diagnosis not present

## 2017-02-23 DIAGNOSIS — R350 Frequency of micturition: Secondary | ICD-10-CM | POA: Diagnosis not present

## 2017-03-02 DIAGNOSIS — Z85828 Personal history of other malignant neoplasm of skin: Secondary | ICD-10-CM | POA: Diagnosis not present

## 2017-03-02 DIAGNOSIS — D0439 Carcinoma in situ of skin of other parts of face: Secondary | ICD-10-CM | POA: Diagnosis not present

## 2017-03-02 DIAGNOSIS — D485 Neoplasm of uncertain behavior of skin: Secondary | ICD-10-CM | POA: Diagnosis not present

## 2017-03-17 DIAGNOSIS — Z6825 Body mass index (BMI) 25.0-25.9, adult: Secondary | ICD-10-CM | POA: Diagnosis not present

## 2017-03-17 DIAGNOSIS — G459 Transient cerebral ischemic attack, unspecified: Secondary | ICD-10-CM | POA: Diagnosis not present

## 2017-03-17 DIAGNOSIS — G4700 Insomnia, unspecified: Secondary | ICD-10-CM | POA: Diagnosis not present

## 2017-03-17 DIAGNOSIS — Z299 Encounter for prophylactic measures, unspecified: Secondary | ICD-10-CM | POA: Diagnosis not present

## 2017-03-17 DIAGNOSIS — I10 Essential (primary) hypertension: Secondary | ICD-10-CM | POA: Diagnosis not present

## 2017-03-17 DIAGNOSIS — R32 Unspecified urinary incontinence: Secondary | ICD-10-CM | POA: Diagnosis not present

## 2017-03-17 DIAGNOSIS — R350 Frequency of micturition: Secondary | ICD-10-CM | POA: Diagnosis not present

## 2017-03-18 DIAGNOSIS — C44329 Squamous cell carcinoma of skin of other parts of face: Secondary | ICD-10-CM | POA: Diagnosis not present

## 2017-04-09 DIAGNOSIS — N390 Urinary tract infection, site not specified: Secondary | ICD-10-CM | POA: Diagnosis not present

## 2017-04-09 DIAGNOSIS — Z6826 Body mass index (BMI) 26.0-26.9, adult: Secondary | ICD-10-CM | POA: Diagnosis not present

## 2017-04-09 DIAGNOSIS — J449 Chronic obstructive pulmonary disease, unspecified: Secondary | ICD-10-CM | POA: Diagnosis not present

## 2017-04-09 DIAGNOSIS — R350 Frequency of micturition: Secondary | ICD-10-CM | POA: Diagnosis not present

## 2017-04-09 DIAGNOSIS — Z299 Encounter for prophylactic measures, unspecified: Secondary | ICD-10-CM | POA: Diagnosis not present

## 2017-04-09 DIAGNOSIS — N183 Chronic kidney disease, stage 3 (moderate): Secondary | ICD-10-CM | POA: Diagnosis not present

## 2017-04-09 DIAGNOSIS — H353290 Exudative age-related macular degeneration, unspecified eye, stage unspecified: Secondary | ICD-10-CM | POA: Diagnosis not present

## 2017-04-28 DIAGNOSIS — R350 Frequency of micturition: Secondary | ICD-10-CM | POA: Diagnosis not present

## 2017-04-28 DIAGNOSIS — N390 Urinary tract infection, site not specified: Secondary | ICD-10-CM | POA: Diagnosis not present

## 2017-04-28 DIAGNOSIS — Z299 Encounter for prophylactic measures, unspecified: Secondary | ICD-10-CM | POA: Diagnosis not present

## 2017-04-28 DIAGNOSIS — R32 Unspecified urinary incontinence: Secondary | ICD-10-CM | POA: Diagnosis not present

## 2017-04-28 DIAGNOSIS — Z789 Other specified health status: Secondary | ICD-10-CM | POA: Diagnosis not present

## 2017-04-28 DIAGNOSIS — I10 Essential (primary) hypertension: Secondary | ICD-10-CM | POA: Diagnosis not present

## 2017-04-28 DIAGNOSIS — Z6826 Body mass index (BMI) 26.0-26.9, adult: Secondary | ICD-10-CM | POA: Diagnosis not present

## 2017-05-13 DIAGNOSIS — Z1231 Encounter for screening mammogram for malignant neoplasm of breast: Secondary | ICD-10-CM | POA: Diagnosis not present

## 2017-05-19 DIAGNOSIS — J479 Bronchiectasis, uncomplicated: Secondary | ICD-10-CM | POA: Diagnosis not present

## 2017-05-25 DIAGNOSIS — K623 Rectal prolapse: Secondary | ICD-10-CM | POA: Diagnosis not present

## 2017-05-25 DIAGNOSIS — K624 Stenosis of anus and rectum: Secondary | ICD-10-CM | POA: Diagnosis not present

## 2017-05-25 DIAGNOSIS — K5900 Constipation, unspecified: Secondary | ICD-10-CM | POA: Diagnosis not present

## 2017-06-11 ENCOUNTER — Ambulatory Visit (INDEPENDENT_AMBULATORY_CARE_PROVIDER_SITE_OTHER): Payer: Medicare Other | Admitting: Urology

## 2017-06-11 ENCOUNTER — Other Ambulatory Visit (HOSPITAL_COMMUNITY)
Admission: RE | Admit: 2017-06-11 | Discharge: 2017-06-11 | Disposition: A | Discharge status: Home / Self Care | Payer: Medicare Other | Source: Other Acute Inpatient Hospital | Attending: Urology | Admitting: Urology

## 2017-06-11 DIAGNOSIS — Z87440 Personal history of urinary (tract) infections: Secondary | ICD-10-CM | POA: Insufficient documentation

## 2017-06-11 DIAGNOSIS — K623 Rectal prolapse: Secondary | ICD-10-CM | POA: Diagnosis not present

## 2017-06-11 DIAGNOSIS — R351 Nocturia: Secondary | ICD-10-CM | POA: Diagnosis not present

## 2017-06-11 DIAGNOSIS — N816 Rectocele: Secondary | ICD-10-CM

## 2017-06-11 DIAGNOSIS — N952 Postmenopausal atrophic vaginitis: Secondary | ICD-10-CM | POA: Diagnosis not present

## 2017-06-11 LAB — URINALYSIS, COMPLETE (UACMP) WITH MICROSCOPIC
Bilirubin Urine: NEGATIVE
Glucose, UA: NEGATIVE mg/dL
Hgb urine dipstick: NEGATIVE
Ketones, ur: NEGATIVE mg/dL
Nitrite: NEGATIVE
PH: 5 (ref 5.0–8.0)
Protein, ur: NEGATIVE mg/dL
SPECIFIC GRAVITY, URINE: 1.011 (ref 1.005–1.030)
SQUAMOUS EPITHELIAL / LPF: NONE SEEN

## 2017-06-14 LAB — URINE CULTURE

## 2017-07-05 ENCOUNTER — Encounter (INDEPENDENT_AMBULATORY_CARE_PROVIDER_SITE_OTHER): Payer: Medicare Other | Admitting: Ophthalmology

## 2017-07-05 DIAGNOSIS — H353221 Exudative age-related macular degeneration, left eye, with active choroidal neovascularization: Secondary | ICD-10-CM

## 2017-07-05 DIAGNOSIS — I10 Essential (primary) hypertension: Secondary | ICD-10-CM | POA: Diagnosis not present

## 2017-07-05 DIAGNOSIS — H35033 Hypertensive retinopathy, bilateral: Secondary | ICD-10-CM | POA: Diagnosis not present

## 2017-07-05 DIAGNOSIS — H353114 Nonexudative age-related macular degeneration, right eye, advanced atrophic with subfoveal involvement: Secondary | ICD-10-CM | POA: Diagnosis not present

## 2017-07-05 DIAGNOSIS — H43813 Vitreous degeneration, bilateral: Secondary | ICD-10-CM | POA: Diagnosis not present

## 2017-07-09 ENCOUNTER — Other Ambulatory Visit (HOSPITAL_COMMUNITY)
Admission: AD | Admit: 2017-07-09 | Discharge: 2017-07-09 | Disposition: A | Discharge status: Home / Self Care | Payer: Medicare Other | Source: Other Acute Inpatient Hospital | Attending: Urology | Admitting: Urology

## 2017-07-09 ENCOUNTER — Ambulatory Visit (INDEPENDENT_AMBULATORY_CARE_PROVIDER_SITE_OTHER): Payer: Medicare Other | Admitting: Urology

## 2017-07-09 DIAGNOSIS — R8271 Bacteriuria: Secondary | ICD-10-CM

## 2017-07-09 DIAGNOSIS — R351 Nocturia: Secondary | ICD-10-CM | POA: Diagnosis not present

## 2017-07-11 LAB — URINE CULTURE

## 2017-07-28 DIAGNOSIS — I639 Cerebral infarction, unspecified: Secondary | ICD-10-CM | POA: Diagnosis not present

## 2017-07-28 DIAGNOSIS — M545 Low back pain: Secondary | ICD-10-CM | POA: Diagnosis not present

## 2017-07-28 DIAGNOSIS — H353290 Exudative age-related macular degeneration, unspecified eye, stage unspecified: Secondary | ICD-10-CM | POA: Diagnosis not present

## 2017-07-28 DIAGNOSIS — I10 Essential (primary) hypertension: Secondary | ICD-10-CM | POA: Diagnosis not present

## 2017-07-28 DIAGNOSIS — Z789 Other specified health status: Secondary | ICD-10-CM | POA: Diagnosis not present

## 2017-07-28 DIAGNOSIS — J449 Chronic obstructive pulmonary disease, unspecified: Secondary | ICD-10-CM | POA: Diagnosis not present

## 2017-07-28 DIAGNOSIS — N183 Chronic kidney disease, stage 3 (moderate): Secondary | ICD-10-CM | POA: Diagnosis not present

## 2017-07-28 DIAGNOSIS — Z299 Encounter for prophylactic measures, unspecified: Secondary | ICD-10-CM | POA: Diagnosis not present

## 2017-07-28 DIAGNOSIS — Z6826 Body mass index (BMI) 26.0-26.9, adult: Secondary | ICD-10-CM | POA: Diagnosis not present

## 2017-07-28 DIAGNOSIS — R5383 Other fatigue: Secondary | ICD-10-CM | POA: Diagnosis not present

## 2017-07-28 DIAGNOSIS — I739 Peripheral vascular disease, unspecified: Secondary | ICD-10-CM | POA: Diagnosis not present

## 2017-07-28 DIAGNOSIS — E039 Hypothyroidism, unspecified: Secondary | ICD-10-CM | POA: Diagnosis not present

## 2017-07-29 DIAGNOSIS — R5383 Other fatigue: Secondary | ICD-10-CM | POA: Diagnosis not present

## 2017-08-27 DIAGNOSIS — Z299 Encounter for prophylactic measures, unspecified: Secondary | ICD-10-CM | POA: Diagnosis not present

## 2017-08-27 DIAGNOSIS — M545 Low back pain: Secondary | ICD-10-CM | POA: Diagnosis not present

## 2017-08-27 DIAGNOSIS — N189 Chronic kidney disease, unspecified: Secondary | ICD-10-CM | POA: Diagnosis not present

## 2017-08-27 DIAGNOSIS — Z6826 Body mass index (BMI) 26.0-26.9, adult: Secondary | ICD-10-CM | POA: Diagnosis not present

## 2017-08-27 DIAGNOSIS — I10 Essential (primary) hypertension: Secondary | ICD-10-CM | POA: Diagnosis not present

## 2017-08-27 DIAGNOSIS — E1165 Type 2 diabetes mellitus with hyperglycemia: Secondary | ICD-10-CM | POA: Diagnosis not present

## 2017-08-27 DIAGNOSIS — J449 Chronic obstructive pulmonary disease, unspecified: Secondary | ICD-10-CM | POA: Diagnosis not present

## 2017-08-27 DIAGNOSIS — I6529 Occlusion and stenosis of unspecified carotid artery: Secondary | ICD-10-CM | POA: Diagnosis not present

## 2017-08-30 DIAGNOSIS — I63039 Cerebral infarction due to thrombosis of unspecified carotid artery: Secondary | ICD-10-CM | POA: Diagnosis not present

## 2017-08-30 DIAGNOSIS — I6522 Occlusion and stenosis of left carotid artery: Secondary | ICD-10-CM | POA: Diagnosis not present

## 2017-10-08 DIAGNOSIS — Z6825 Body mass index (BMI) 25.0-25.9, adult: Secondary | ICD-10-CM | POA: Diagnosis not present

## 2017-10-08 DIAGNOSIS — N183 Chronic kidney disease, stage 3 (moderate): Secondary | ICD-10-CM | POA: Diagnosis not present

## 2017-10-08 DIAGNOSIS — R252 Cramp and spasm: Secondary | ICD-10-CM | POA: Diagnosis not present

## 2017-10-08 DIAGNOSIS — Z299 Encounter for prophylactic measures, unspecified: Secondary | ICD-10-CM | POA: Diagnosis not present

## 2017-10-08 DIAGNOSIS — R350 Frequency of micturition: Secondary | ICD-10-CM | POA: Diagnosis not present

## 2017-10-08 DIAGNOSIS — I6522 Occlusion and stenosis of left carotid artery: Secondary | ICD-10-CM | POA: Diagnosis not present

## 2017-10-08 DIAGNOSIS — E1165 Type 2 diabetes mellitus with hyperglycemia: Secondary | ICD-10-CM | POA: Diagnosis not present

## 2017-10-08 DIAGNOSIS — E1122 Type 2 diabetes mellitus with diabetic chronic kidney disease: Secondary | ICD-10-CM | POA: Diagnosis not present

## 2017-10-08 DIAGNOSIS — I10 Essential (primary) hypertension: Secondary | ICD-10-CM | POA: Diagnosis not present

## 2017-11-08 DIAGNOSIS — H353290 Exudative age-related macular degeneration, unspecified eye, stage unspecified: Secondary | ICD-10-CM | POA: Diagnosis not present

## 2017-11-08 DIAGNOSIS — R252 Cramp and spasm: Secondary | ICD-10-CM | POA: Diagnosis not present

## 2017-11-08 DIAGNOSIS — R350 Frequency of micturition: Secondary | ICD-10-CM | POA: Diagnosis not present

## 2017-11-08 DIAGNOSIS — I10 Essential (primary) hypertension: Secondary | ICD-10-CM | POA: Diagnosis not present

## 2017-11-08 DIAGNOSIS — N183 Chronic kidney disease, stage 3 (moderate): Secondary | ICD-10-CM | POA: Diagnosis not present

## 2017-11-08 DIAGNOSIS — I739 Peripheral vascular disease, unspecified: Secondary | ICD-10-CM | POA: Diagnosis not present

## 2017-11-08 DIAGNOSIS — Z6825 Body mass index (BMI) 25.0-25.9, adult: Secondary | ICD-10-CM | POA: Diagnosis not present

## 2017-11-08 DIAGNOSIS — Z299 Encounter for prophylactic measures, unspecified: Secondary | ICD-10-CM | POA: Diagnosis not present

## 2017-11-15 ENCOUNTER — Emergency Department (HOSPITAL_COMMUNITY)
Admission: EM | Admit: 2017-11-15 | Discharge: 2017-11-16 | Disposition: A | Discharge status: Home / Self Care | Payer: Medicare Other | Attending: Emergency Medicine | Admitting: Emergency Medicine

## 2017-11-15 ENCOUNTER — Encounter (HOSPITAL_COMMUNITY): Payer: Self-pay | Admitting: Emergency Medicine

## 2017-11-15 ENCOUNTER — Other Ambulatory Visit: Payer: Self-pay

## 2017-11-15 DIAGNOSIS — D649 Anemia, unspecified: Secondary | ICD-10-CM | POA: Diagnosis not present

## 2017-11-15 DIAGNOSIS — Z7982 Long term (current) use of aspirin: Secondary | ICD-10-CM | POA: Insufficient documentation

## 2017-11-15 DIAGNOSIS — N289 Disorder of kidney and ureter, unspecified: Secondary | ICD-10-CM | POA: Diagnosis not present

## 2017-11-15 DIAGNOSIS — I10 Essential (primary) hypertension: Secondary | ICD-10-CM | POA: Diagnosis not present

## 2017-11-15 DIAGNOSIS — J449 Chronic obstructive pulmonary disease, unspecified: Secondary | ICD-10-CM | POA: Insufficient documentation

## 2017-11-15 DIAGNOSIS — R531 Weakness: Secondary | ICD-10-CM

## 2017-11-15 DIAGNOSIS — N390 Urinary tract infection, site not specified: Secondary | ICD-10-CM

## 2017-11-15 DIAGNOSIS — Z79899 Other long term (current) drug therapy: Secondary | ICD-10-CM | POA: Insufficient documentation

## 2017-11-15 HISTORY — DX: Unspecified asthma, uncomplicated: J45.909

## 2017-11-15 LAB — CBC WITH DIFFERENTIAL/PLATELET
Basophils Absolute: 0 10*3/uL (ref 0.0–0.1)
Basophils Relative: 0 %
Eosinophils Absolute: 0.4 10*3/uL (ref 0.0–0.7)
Eosinophils Relative: 6 %
HCT: 34.2 % — ABNORMAL LOW (ref 36.0–46.0)
Hemoglobin: 10.8 g/dL — ABNORMAL LOW (ref 12.0–15.0)
Lymphocytes Relative: 25 %
Lymphs Abs: 1.9 10*3/uL (ref 0.7–4.0)
MCH: 28.6 pg (ref 26.0–34.0)
MCHC: 31.6 g/dL (ref 30.0–36.0)
MCV: 90.7 fL (ref 78.0–100.0)
MONO ABS: 1.2 10*3/uL — AB (ref 0.1–1.0)
MONOS PCT: 15 %
Neutro Abs: 4.2 10*3/uL (ref 1.7–7.7)
Neutrophils Relative %: 54 %
PLATELETS: 295 10*3/uL (ref 150–400)
RBC: 3.77 MIL/uL — ABNORMAL LOW (ref 3.87–5.11)
RDW: 15.2 % (ref 11.5–15.5)
WBC: 7.8 10*3/uL (ref 4.0–10.5)

## 2017-11-15 LAB — BASIC METABOLIC PANEL
Anion gap: 6 (ref 5–15)
BUN: 24 mg/dL — ABNORMAL HIGH (ref 8–23)
CO2: 27 mmol/L (ref 22–32)
CREATININE: 1.99 mg/dL — AB (ref 0.44–1.00)
Calcium: 8.8 mg/dL — ABNORMAL LOW (ref 8.9–10.3)
Chloride: 101 mmol/L (ref 98–111)
GFR calc non Af Amer: 20 mL/min — ABNORMAL LOW (ref >60)
GFR, EST AFRICAN AMERICAN: 24 mL/min — AB (ref >60)
Glucose, Bld: 131 mg/dL — ABNORMAL HIGH (ref 70–99)
Potassium: 5.1 mmol/L (ref 3.5–5.1)
Sodium: 134 mmol/L — ABNORMAL LOW (ref 135–145)

## 2017-11-15 LAB — URINALYSIS, ROUTINE W REFLEX MICROSCOPIC
Bilirubin Urine: NEGATIVE
GLUCOSE, UA: NEGATIVE mg/dL
Hgb urine dipstick: NEGATIVE
KETONES UR: NEGATIVE mg/dL
Nitrite: POSITIVE — AB
PH: 5 (ref 5.0–8.0)
PROTEIN: NEGATIVE mg/dL
Specific Gravity, Urine: 1.009 (ref 1.005–1.030)

## 2017-11-15 NOTE — ED Triage Notes (Signed)
Pt c/o generalized weakness 2-3 weeks. Pt states for last couple of months pcp has been treating her for uti which she states is no better.

## 2017-11-15 NOTE — ED Notes (Signed)
[name] Smart [phone] Daughter

## 2017-11-15 NOTE — ED Provider Notes (Signed)
[facility] EMERGENCY DEPARTMENT Provider Note   CSN: [id number] Arrival date & time: 11/15/17  2000     History   Chief Complaint Chief Complaint  Patient presents with  . Weakness    HPI [name] is a 85 y.o. female.  The history is provided by the patient.  She states that yesterday, she was very weak to the point where she could not stand.  But today, she has been able to stand and walk normally.  She has been treated for urinary tract infection for the last 3 months.  She has been on at least 3 different antibiotics, but the only one that she knows the name is the current one which is trimethoprim-sulfamethoxazole.  She started that one week ago.  She denies dysuria, but has had urinary urgency and frequency.  She denies urinary tenesmus.  There has been some mild pain in the left flank.  She denies fever but has had some chills and sweats.  There has been no nausea or vomiting.  She denies abdominal pain.  She states that urine has been sent for cultures, and the current antibiotic was supposedly chosen because of the culture results.  Past Medical History:  Diagnosis Date  . Arthritis   . Asthma   . Cancer (HCC)    SCC: Face  . COPD (chronic obstructive pulmonary disease) ([place])   . Esophageal reflux   . Fibromyalgia   . Hyperlipidemia   . Hypertension   . Injury of left leg July 2013   Patient hit left leg  . Lipoma of arm    Right arm  . Peripheral arterial disease (HCC)    Left Lateral leg ulcer  . Restless legs syndrome   . Stroke [facility]) 2010   Right hand pain after stroke    Patient Active Problem List   Diagnosis Date Noted  . Chronic ischemic vertebrobasilar artery thalamic stroke 06/26/2015  . Atherosclerosis of native arteries of the extremities with ulceration(440.23) 01/13/2012    Past Surgical History:  Procedure Laterality Date  . ABDOMINAL HYSTERECTOMY  1981  . BIOPSY BREAST  1986   Bx and Aspiration , Ultrasound  . EYE MUSCLE SURGERY   2010    27 shots of Avastin Left eye  on going  . EYE SURGERY  1999   Cataract Bilateral eyes  . EYE SURGERY  2000   Yag Laser Bilateral eyes  . RECTOCELE REPAIR    . STERILIZATION  1952  . THYROIDECTOMY  1968  . TONSILLECTOMY  1946     OB History    Gravida  3   Para  1   Term  1   Preterm      AB  2   Living        SAB  2   TAB      Ectopic      Multiple      Live Births               Home Medications    Prior to Admission medications   Medication Sig Start Date End Date Taking? Authorizing Provider  albuterol-ipratropium (COMBIVENT) 18-103 MCG/ACT inhaler Inhale 1 puff into the lungs daily as needed for wheezing or shortness of breath.    [provider]  amitriptyline (ELAVIL) 25 MG tablet Take 12.5-25 mg by mouth at bedtime.    [provider]  amLODipine (NORVASC) 10 MG tablet Take 10 mg by mouth daily.    [provider]  aspirin 81 MG tablet Take 81 mg by mouth at bedtime.     [provider]  calcium carbonate (OS-CAL) 600 MG TABS Take 600 mg by mouth daily.    [provider]  celecoxib (CELEBREX) 200 MG capsule Take 200 mg by mouth daily.    [provider]  cloNIDine (CATAPRES) 0.3 MG tablet Take 0.3 mg by mouth 2 (two) times daily.  10/27/15   [provider]  diphenhydrAMINE (BENADRYL) 25 MG tablet Take 25 mg by mouth at bedtime.    [provider]  esomeprazole (NEXIUM) 40 MG capsule Take 40 mg by mouth daily before breakfast.    [provider]  estradiol (ESTRACE) 0.1 MG/GM vaginal cream Place 1 g vaginally as needed (irritation). Reported on 05/15/2015    [provider]  fluticasone (FLONASE) 50 MCG/ACT nasal spray Place 1 spray into the nose daily.    [provider]  furosemide (LASIX) 20 MG tablet Take 20 mg by mouth daily.  09/04/15   [provider]  gabapentin (NEURONTIN) 300 MG capsule Take 300-600 mg by mouth 2 (two) times daily.  300 mg in the morning and 600 mg at bedtime.    [provider]  hydrocortisone (ANUSOL-HC) 25 MG suppository Place 1 suppository (25 mg total) rectally 2 (two) times daily. Patient taking differently: Place 25 mg rectally 2 (two) times daily as needed for hemorrhoids.  12/05/15   Tanna Furry, MD  ipratropium-albuterol (DUONEB) 0.5-2.5 (3) MG/3ML SOLN Take 3 mLs by nebulization daily as needed (shortness of breath).  10/21/11   [provider]  levofloxacin (LEVAQUIN) 500 MG tablet Take 1 tablet by mouth daily. 01/31/16   [provider]  lisinopril (PRINIVIL,ZESTRIL) 40 MG tablet Take 40 mg by mouth daily.  11/23/11   [provider]  meclizine (ANTIVERT) 25 MG tablet Take 25 mg by mouth daily as needed for dizziness.     [provider]  Melatonin 3 MG TABS Take 3 mg by mouth daily.    [provider]  montelukast (SINGULAIR) 10 MG tablet Take 10 mg by mouth at bedtime.  10/19/11   [provider]  Multiple Vitamin (MULTIVITAMIN) tablet Take 1 tablet by mouth daily.    [provider]  Multiple Vitamins-Minerals (PRESERVISION AREDS PO) Take 1 tablet by mouth 2 (two) times daily.     [provider]  polyethylene glycol powder (MIRALAX) powder Take 1 g by mouth daily.    [provider]  PREVIDENT 5000 DRY MOUTH 1.1 % PSTE Take 1 application by mouth daily.  10/19/11   [provider]  RESTASIS 0.05 % ophthalmic emulsion Place 1 drop into both eyes 2 (two) times daily.  10/19/11   [provider]  rOPINIRole (REQUIP) 1 MG tablet Take 2 mg by mouth 2 (two) times daily.  10/29/11   [provider]  simvastatin (ZOCOR) 20 MG tablet Take 20 mg by mouth daily at 6 PM.  11/23/11   [provider]  SYMBICORT 160-4.5 MCG/ACT inhaler Inhale 2 puffs into the lungs Twice daily.  10/19/11   [provider]  SYNTHROID 100 MCG tablet Take 100 mcg by mouth daily before breakfast.  11/11/15    [provider]  traMADol (ULTRAM) 50 MG tablet Take 50 mg by mouth 3 (three) times daily.     [provider]    Family History Family History  Problem Relation Age of Onset  . Cancer Mother   .  Heart disease Father   . Cancer Sister        Breast  . Cancer Brother   . Cancer Daughter        BCC: back and face  . Diabetes Daughter   . Other Maternal Grandmother        Cramps in legs  . Cancer Sister   . Cancer Sister   . Cancer Brother   . Cancer Brother   . Stroke Neg Hx     Social History Social History   Tobacco Use  . Smoking status: Never Smoker  . Smokeless tobacco: Never Used  Substance Use Topics  . Alcohol use: No  . Drug use: No     Allergies   Valium [diazepam]   Review of Systems Review of Systems  All other systems reviewed and are negative.    Physical Exam Updated Vital Signs BP 128/61   Pulse 92   Temp 98 F (36.7 C)   Resp 18   Ht 5\' 6"  (1.676 m)   Wt 74.8 kg (165 lb)   SpO2 92%   BMI 26.63 kg/m   Physical Exam  Nursing note and vitals reviewed.  84 year old female, resting comfortably and in no acute distress. Vital signs are normal. Oxygen saturation is 92%, which is normal. Head is normocephalic and atraumatic. PERRLA, EOMI. Oropharynx is clear. Neck is nontender and supple without adenopathy or JVD. Back is nontender and there is no CVA tenderness. Lungs are clear without rales, wheezes, or rhonchi. Chest is nontender. Heart has regular rate and rhythm without murmur. Abdomen is soft, flat, nontender without masses or hepatosplenomegaly and peristalsis is normoactive. Extremities have no cyanosis or edema, full range of motion is present. Skin is warm and dry without rash. Neurologic: Mental status is normal, cranial nerves are intact, there are no motor or sensory deficits.  ED Treatments / Results  Labs (all labs ordered are listed, but only abnormal results are displayed) Labs Reviewed    URINALYSIS, ROUTINE W REFLEX MICROSCOPIC - Abnormal; Notable for the following components:      Result Value   APPearance HAZY (*)    Nitrite POSITIVE (*)    Leukocytes, UA MODERATE (*)    Bacteria, UA MANY (*)    All other components within normal limits  BASIC METABOLIC PANEL - Abnormal; Notable for the following components:   Sodium 134 (*)    Glucose, Bld 131 (*)    BUN 24 (*)    Creatinine, Ser 1.99 (*)    Calcium 8.8 (*)    GFR calc non Af Amer 20 (*)    GFR calc Af Amer 24 (*)    All other components within normal limits  CBC WITH DIFFERENTIAL/PLATELET - Abnormal; Notable for the following components:   RBC 3.77 (*)    Hemoglobin 10.8 (*)    HCT 34.2 (*)    Monocytes Absolute 1.2 (*)    All other components within normal limits  URINE CULTURE   Procedures Procedures   Medications Ordered in ED Medications  cephALEXin (KEFLEX) capsule 250 mg (has no administration in time range)     Initial Impression / Assessment and Plan / ED Course  I have reviewed the triage vital signs and the nursing notes.  Pertinent lab results that were available during my care of the patient were reviewed by me and considered in my medical decision making (see chart for details).  Weakness which apparently has resolved.  Urinary tract infection.  Urinalysis today does show pyuria with many bacteria and positive nitrite.  Mild anemia is present.  On care everywhere, I have found lab tests from December 2017 which showed hemoglobin in the same range.  She also has renal insufficiency with creatinine of 1.99.  In December 2017, renal function was normal.  However, patient states she has been told she has stage III kidney disease which is consistent with current creatinine.  Urine is sent for culture.  At this point, I do not see an indication for admission.  She is discharged with prescription for cephalexin and advised to stop taking her sulfamethoxazole-trimethoprim.  She is to follow-up with  her urologist in 3 days by which time urine culture results will be back and antibiotics can be changed, if necessary.  Final Clinical Impressions(s) / ED Diagnoses   Final diagnoses:  Urinary tract infection without hematuria, site unspecified  Weakness  Renal insufficiency  Normochromic normocytic anemia    ED Discharge Orders        Ordered    cephALEXin (KEFLEX) 250 MG capsule  3 times daily     85/27/78 2423       Delora Fuel, MD 53/61/44 [phone]

## 2017-11-16 DIAGNOSIS — N390 Urinary tract infection, site not specified: Secondary | ICD-10-CM | POA: Diagnosis not present

## 2017-11-16 MED ORDER — CEPHALEXIN 250 MG PO CAPS
250.0000 mg | ORAL_CAPSULE | Freq: Once | ORAL | Status: AC
  Administered 2017-11-16: 250 mg via ORAL
  Filled 2017-11-16 (×2): qty 1

## 2017-11-16 MED ORDER — CEPHALEXIN 250 MG PO CAPS: 250.0000 mg | ORAL_CAPSULE | Freq: Three times a day (TID) | ORAL | 0 refills | Status: AC

## 2017-11-16 NOTE — Discharge Instructions (Addendum)
Stop taking sulfamethoxazole-trimethoprim.  See your urologist in three days. The urine culture should be back by then, and he can change the antibiotic if you need to be on a different one.

## 2017-11-18 LAB — URINE CULTURE

## 2017-11-19 ENCOUNTER — Telehealth: Payer: Self-pay

## 2017-11-19 NOTE — Telephone Encounter (Signed)
Post ED Visit - Positive Culture Follow-up  Culture report reviewed by antimicrobial stewardship pharmacist:  []  Elenor Quinones, Pharm.D. []  Heide Guile, Pharm.D., BCPS AQ-ID []  Parks Neptune, Pharm.D., BCPS []  Alycia Rossetti, Pharm.D., BCPS []  Davy, Pharm.D., BCPS, AAHIVP []  Legrand Como, Pharm.D., BCPS, AAHIVP []  Salome Arnt, PharmD, BCPS []  Johnnette Gourd, PharmD, BCPS []  Hughes Better, PharmD, BCPS []  Leeroy Cha, PharmD [facility] "D Positive urine culture Treated with Cephalexin, organism sensitive to the same and no further patient follow-up is required at this time.  Genia Del 11/19/2017, 10:25 AM

## 2017-11-24 DIAGNOSIS — J471 Bronchiectasis with (acute) exacerbation: Secondary | ICD-10-CM | POA: Diagnosis not present

## 2017-11-24 DIAGNOSIS — R05 Cough: Secondary | ICD-10-CM | POA: Diagnosis not present

## 2017-12-03 ENCOUNTER — Other Ambulatory Visit (HOSPITAL_COMMUNITY)
Admission: RE | Admit: 2017-12-03 | Discharge: 2017-12-03 | Disposition: A | Discharge status: Home / Self Care | Payer: Medicare Other | Source: Ambulatory Visit | Attending: Urology | Admitting: Urology

## 2017-12-03 ENCOUNTER — Ambulatory Visit (INDEPENDENT_AMBULATORY_CARE_PROVIDER_SITE_OTHER): Payer: Medicare Other | Admitting: Urology

## 2017-12-03 DIAGNOSIS — N3020 Other chronic cystitis without hematuria: Secondary | ICD-10-CM | POA: Diagnosis not present

## 2017-12-03 DIAGNOSIS — R351 Nocturia: Secondary | ICD-10-CM

## 2017-12-06 LAB — URINE CULTURE

## 2017-12-10 DIAGNOSIS — R062 Wheezing: Secondary | ICD-10-CM | POA: Diagnosis not present

## 2017-12-29 DIAGNOSIS — Z791 Long term (current) use of non-steroidal anti-inflammatories (NSAID): Secondary | ICD-10-CM | POA: Diagnosis not present

## 2017-12-29 DIAGNOSIS — R0602 Shortness of breath: Secondary | ICD-10-CM | POA: Diagnosis not present

## 2017-12-29 DIAGNOSIS — E039 Hypothyroidism, unspecified: Secondary | ICD-10-CM | POA: Diagnosis present

## 2017-12-29 DIAGNOSIS — M069 Rheumatoid arthritis, unspecified: Secondary | ICD-10-CM | POA: Diagnosis present

## 2017-12-29 DIAGNOSIS — R638 Other symptoms and signs concerning food and fluid intake: Secondary | ICD-10-CM | POA: Diagnosis not present

## 2017-12-29 DIAGNOSIS — I129 Hypertensive chronic kidney disease with stage 1 through stage 4 chronic kidney disease, or unspecified chronic kidney disease: Secondary | ICD-10-CM | POA: Diagnosis present

## 2017-12-29 DIAGNOSIS — E860 Dehydration: Secondary | ICD-10-CM | POA: Diagnosis present

## 2017-12-29 DIAGNOSIS — N189 Chronic kidney disease, unspecified: Secondary | ICD-10-CM | POA: Diagnosis present

## 2017-12-29 DIAGNOSIS — K219 Gastro-esophageal reflux disease without esophagitis: Secondary | ICD-10-CM | POA: Diagnosis not present

## 2017-12-29 DIAGNOSIS — Z9981 Dependence on supplemental oxygen: Secondary | ICD-10-CM | POA: Diagnosis not present

## 2017-12-29 DIAGNOSIS — R5381 Other malaise: Secondary | ICD-10-CM | POA: Diagnosis not present

## 2017-12-29 DIAGNOSIS — G2581 Restless legs syndrome: Secondary | ICD-10-CM | POA: Diagnosis present

## 2017-12-29 DIAGNOSIS — Z7982 Long term (current) use of aspirin: Secondary | ICD-10-CM | POA: Diagnosis not present

## 2017-12-29 DIAGNOSIS — J181 Lobar pneumonia, unspecified organism: Secondary | ICD-10-CM | POA: Diagnosis not present

## 2017-12-29 DIAGNOSIS — M797 Fibromyalgia: Secondary | ICD-10-CM | POA: Diagnosis present

## 2017-12-29 DIAGNOSIS — Z8673 Personal history of transient ischemic attack (TIA), and cerebral infarction without residual deficits: Secondary | ICD-10-CM | POA: Diagnosis not present

## 2017-12-29 DIAGNOSIS — R4182 Altered mental status, unspecified: Secondary | ICD-10-CM | POA: Diagnosis not present

## 2017-12-29 DIAGNOSIS — J9611 Chronic respiratory failure with hypoxia: Secondary | ICD-10-CM | POA: Diagnosis present

## 2017-12-29 DIAGNOSIS — Z23 Encounter for immunization: Secondary | ICD-10-CM | POA: Diagnosis not present

## 2017-12-29 DIAGNOSIS — N179 Acute kidney failure, unspecified: Secondary | ICD-10-CM | POA: Diagnosis not present

## 2017-12-29 DIAGNOSIS — R0600 Dyspnea, unspecified: Secondary | ICD-10-CM | POA: Diagnosis not present

## 2017-12-29 DIAGNOSIS — Z79899 Other long term (current) drug therapy: Secondary | ICD-10-CM | POA: Diagnosis not present

## 2017-12-29 DIAGNOSIS — J189 Pneumonia, unspecified organism: Secondary | ICD-10-CM | POA: Diagnosis not present

## 2017-12-29 DIAGNOSIS — G629 Polyneuropathy, unspecified: Secondary | ICD-10-CM | POA: Diagnosis present

## 2018-01-03 DIAGNOSIS — R0602 Shortness of breath: Secondary | ICD-10-CM | POA: Diagnosis not present

## 2018-01-03 DIAGNOSIS — K219 Gastro-esophageal reflux disease without esophagitis: Secondary | ICD-10-CM | POA: Diagnosis not present

## 2018-01-03 DIAGNOSIS — J9611 Chronic respiratory failure with hypoxia: Secondary | ICD-10-CM | POA: Diagnosis not present

## 2018-01-03 DIAGNOSIS — G9389 Other specified disorders of brain: Secondary | ICD-10-CM | POA: Diagnosis not present

## 2018-01-03 DIAGNOSIS — J069 Acute upper respiratory infection, unspecified: Secondary | ICD-10-CM | POA: Diagnosis not present

## 2018-01-03 DIAGNOSIS — Z8673 Personal history of transient ischemic attack (TIA), and cerebral infarction without residual deficits: Secondary | ICD-10-CM | POA: Diagnosis not present

## 2018-01-03 DIAGNOSIS — Z7982 Long term (current) use of aspirin: Secondary | ICD-10-CM | POA: Diagnosis not present

## 2018-01-03 DIAGNOSIS — G629 Polyneuropathy, unspecified: Secondary | ICD-10-CM | POA: Diagnosis not present

## 2018-01-03 DIAGNOSIS — M069 Rheumatoid arthritis, unspecified: Secondary | ICD-10-CM | POA: Diagnosis present

## 2018-01-03 DIAGNOSIS — I959 Hypotension, unspecified: Secondary | ICD-10-CM | POA: Diagnosis not present

## 2018-01-03 DIAGNOSIS — M533 Sacrococcygeal disorders, not elsewhere classified: Secondary | ICD-10-CM | POA: Diagnosis not present

## 2018-01-03 DIAGNOSIS — T464X5A Adverse effect of angiotensin-converting-enzyme inhibitors, initial encounter: Secondary | ICD-10-CM | POA: Diagnosis not present

## 2018-01-03 DIAGNOSIS — R0902 Hypoxemia: Secondary | ICD-10-CM | POA: Diagnosis not present

## 2018-01-03 DIAGNOSIS — G2581 Restless legs syndrome: Secondary | ICD-10-CM | POA: Diagnosis present

## 2018-01-03 DIAGNOSIS — S3992XA Unspecified injury of lower back, initial encounter: Secondary | ICD-10-CM | POA: Diagnosis not present

## 2018-01-03 DIAGNOSIS — J189 Pneumonia, unspecified organism: Secondary | ICD-10-CM | POA: Diagnosis not present

## 2018-01-03 DIAGNOSIS — Z79899 Other long term (current) drug therapy: Secondary | ICD-10-CM | POA: Diagnosis not present

## 2018-01-03 DIAGNOSIS — Z791 Long term (current) use of non-steroidal anti-inflammatories (NSAID): Secondary | ICD-10-CM | POA: Diagnosis not present

## 2018-01-03 DIAGNOSIS — M797 Fibromyalgia: Secondary | ICD-10-CM | POA: Diagnosis present

## 2018-01-03 DIAGNOSIS — H3530 Unspecified macular degeneration: Secondary | ICD-10-CM | POA: Diagnosis present

## 2018-01-03 DIAGNOSIS — J42 Unspecified chronic bronchitis: Secondary | ICD-10-CM | POA: Diagnosis not present

## 2018-01-03 DIAGNOSIS — I10 Essential (primary) hypertension: Secondary | ICD-10-CM | POA: Diagnosis present

## 2018-01-03 DIAGNOSIS — Z9981 Dependence on supplemental oxygen: Secondary | ICD-10-CM | POA: Diagnosis not present

## 2018-01-03 DIAGNOSIS — N179 Acute kidney failure, unspecified: Secondary | ICD-10-CM | POA: Diagnosis not present

## 2018-01-05 ENCOUNTER — Encounter (INDEPENDENT_AMBULATORY_CARE_PROVIDER_SITE_OTHER): Payer: Medicare Other | Admitting: Ophthalmology

## 2018-01-07 DIAGNOSIS — J189 Pneumonia, unspecified organism: Secondary | ICD-10-CM | POA: Diagnosis not present

## 2018-01-07 DIAGNOSIS — N179 Acute kidney failure, unspecified: Secondary | ICD-10-CM | POA: Diagnosis not present

## 2018-01-07 DIAGNOSIS — K219 Gastro-esophageal reflux disease without esophagitis: Secondary | ICD-10-CM | POA: Diagnosis not present

## 2018-01-07 DIAGNOSIS — Z8673 Personal history of transient ischemic attack (TIA), and cerebral infarction without residual deficits: Secondary | ICD-10-CM | POA: Diagnosis not present

## 2018-01-07 DIAGNOSIS — F329 Major depressive disorder, single episode, unspecified: Secondary | ICD-10-CM | POA: Diagnosis not present

## 2018-01-07 DIAGNOSIS — M069 Rheumatoid arthritis, unspecified: Secondary | ICD-10-CM | POA: Diagnosis not present

## 2018-01-07 DIAGNOSIS — G629 Polyneuropathy, unspecified: Secondary | ICD-10-CM | POA: Diagnosis not present

## 2018-01-07 DIAGNOSIS — M797 Fibromyalgia: Secondary | ICD-10-CM | POA: Diagnosis not present

## 2018-01-07 DIAGNOSIS — J9611 Chronic respiratory failure with hypoxia: Secondary | ICD-10-CM | POA: Diagnosis not present

## 2018-01-07 DIAGNOSIS — I129 Hypertensive chronic kidney disease with stage 1 through stage 4 chronic kidney disease, or unspecified chronic kidney disease: Secondary | ICD-10-CM | POA: Diagnosis not present

## 2018-01-07 DIAGNOSIS — H3530 Unspecified macular degeneration: Secondary | ICD-10-CM | POA: Diagnosis not present

## 2018-01-07 DIAGNOSIS — N189 Chronic kidney disease, unspecified: Secondary | ICD-10-CM | POA: Diagnosis not present

## 2018-01-07 DIAGNOSIS — E039 Hypothyroidism, unspecified: Secondary | ICD-10-CM | POA: Diagnosis not present

## 2018-01-07 DIAGNOSIS — J40 Bronchitis, not specified as acute or chronic: Secondary | ICD-10-CM | POA: Diagnosis not present

## 2018-01-10 DIAGNOSIS — Z09 Encounter for follow-up examination after completed treatment for conditions other than malignant neoplasm: Secondary | ICD-10-CM | POA: Diagnosis not present

## 2018-01-10 DIAGNOSIS — I10 Essential (primary) hypertension: Secondary | ICD-10-CM | POA: Diagnosis not present

## 2018-01-10 DIAGNOSIS — R5383 Other fatigue: Secondary | ICD-10-CM | POA: Diagnosis not present

## 2018-01-10 DIAGNOSIS — I959 Hypotension, unspecified: Secondary | ICD-10-CM | POA: Diagnosis not present

## 2018-01-10 DIAGNOSIS — R609 Edema, unspecified: Secondary | ICD-10-CM | POA: Diagnosis not present

## 2018-01-10 DIAGNOSIS — Z6825 Body mass index (BMI) 25.0-25.9, adult: Secondary | ICD-10-CM | POA: Diagnosis not present

## 2018-01-10 DIAGNOSIS — Z299 Encounter for prophylactic measures, unspecified: Secondary | ICD-10-CM | POA: Diagnosis not present

## 2018-01-11 DIAGNOSIS — I129 Hypertensive chronic kidney disease with stage 1 through stage 4 chronic kidney disease, or unspecified chronic kidney disease: Secondary | ICD-10-CM | POA: Diagnosis not present

## 2018-01-11 DIAGNOSIS — J189 Pneumonia, unspecified organism: Secondary | ICD-10-CM | POA: Diagnosis not present

## 2018-01-11 DIAGNOSIS — N189 Chronic kidney disease, unspecified: Secondary | ICD-10-CM | POA: Diagnosis not present

## 2018-01-11 DIAGNOSIS — M797 Fibromyalgia: Secondary | ICD-10-CM | POA: Diagnosis not present

## 2018-01-11 DIAGNOSIS — J40 Bronchitis, not specified as acute or chronic: Secondary | ICD-10-CM | POA: Diagnosis not present

## 2018-01-11 DIAGNOSIS — G629 Polyneuropathy, unspecified: Secondary | ICD-10-CM | POA: Diagnosis not present

## 2018-01-13 DIAGNOSIS — J189 Pneumonia, unspecified organism: Secondary | ICD-10-CM | POA: Diagnosis not present

## 2018-01-13 DIAGNOSIS — G629 Polyneuropathy, unspecified: Secondary | ICD-10-CM | POA: Diagnosis not present

## 2018-01-13 DIAGNOSIS — J40 Bronchitis, not specified as acute or chronic: Secondary | ICD-10-CM | POA: Diagnosis not present

## 2018-01-13 DIAGNOSIS — I129 Hypertensive chronic kidney disease with stage 1 through stage 4 chronic kidney disease, or unspecified chronic kidney disease: Secondary | ICD-10-CM | POA: Diagnosis not present

## 2018-01-13 DIAGNOSIS — M797 Fibromyalgia: Secondary | ICD-10-CM | POA: Diagnosis not present

## 2018-01-13 DIAGNOSIS — N189 Chronic kidney disease, unspecified: Secondary | ICD-10-CM | POA: Diagnosis not present

## 2018-01-14 DIAGNOSIS — N189 Chronic kidney disease, unspecified: Secondary | ICD-10-CM | POA: Diagnosis not present

## 2018-01-14 DIAGNOSIS — J189 Pneumonia, unspecified organism: Secondary | ICD-10-CM | POA: Diagnosis not present

## 2018-01-14 DIAGNOSIS — M797 Fibromyalgia: Secondary | ICD-10-CM | POA: Diagnosis not present

## 2018-01-14 DIAGNOSIS — I129 Hypertensive chronic kidney disease with stage 1 through stage 4 chronic kidney disease, or unspecified chronic kidney disease: Secondary | ICD-10-CM | POA: Diagnosis not present

## 2018-01-14 DIAGNOSIS — J40 Bronchitis, not specified as acute or chronic: Secondary | ICD-10-CM | POA: Diagnosis not present

## 2018-01-14 DIAGNOSIS — G629 Polyneuropathy, unspecified: Secondary | ICD-10-CM | POA: Diagnosis not present

## 2018-01-17 ENCOUNTER — Encounter (INDEPENDENT_AMBULATORY_CARE_PROVIDER_SITE_OTHER): Payer: Medicare Other | Admitting: Ophthalmology

## 2018-01-17 DIAGNOSIS — H353114 Nonexudative age-related macular degeneration, right eye, advanced atrophic with subfoveal involvement: Secondary | ICD-10-CM | POA: Diagnosis not present

## 2018-01-17 DIAGNOSIS — J40 Bronchitis, not specified as acute or chronic: Secondary | ICD-10-CM | POA: Diagnosis not present

## 2018-01-17 DIAGNOSIS — J189 Pneumonia, unspecified organism: Secondary | ICD-10-CM | POA: Diagnosis not present

## 2018-01-17 DIAGNOSIS — I10 Essential (primary) hypertension: Secondary | ICD-10-CM

## 2018-01-17 DIAGNOSIS — M797 Fibromyalgia: Secondary | ICD-10-CM | POA: Diagnosis not present

## 2018-01-17 DIAGNOSIS — H35033 Hypertensive retinopathy, bilateral: Secondary | ICD-10-CM

## 2018-01-17 DIAGNOSIS — N189 Chronic kidney disease, unspecified: Secondary | ICD-10-CM | POA: Diagnosis not present

## 2018-01-17 DIAGNOSIS — G629 Polyneuropathy, unspecified: Secondary | ICD-10-CM | POA: Diagnosis not present

## 2018-01-17 DIAGNOSIS — H353221 Exudative age-related macular degeneration, left eye, with active choroidal neovascularization: Secondary | ICD-10-CM | POA: Diagnosis not present

## 2018-01-17 DIAGNOSIS — H43813 Vitreous degeneration, bilateral: Secondary | ICD-10-CM | POA: Diagnosis not present

## 2018-01-17 DIAGNOSIS — I129 Hypertensive chronic kidney disease with stage 1 through stage 4 chronic kidney disease, or unspecified chronic kidney disease: Secondary | ICD-10-CM | POA: Diagnosis not present

## 2018-01-18 DIAGNOSIS — J40 Bronchitis, not specified as acute or chronic: Secondary | ICD-10-CM | POA: Diagnosis not present

## 2018-01-18 DIAGNOSIS — M797 Fibromyalgia: Secondary | ICD-10-CM | POA: Diagnosis not present

## 2018-01-18 DIAGNOSIS — N189 Chronic kidney disease, unspecified: Secondary | ICD-10-CM | POA: Diagnosis not present

## 2018-01-18 DIAGNOSIS — I129 Hypertensive chronic kidney disease with stage 1 through stage 4 chronic kidney disease, or unspecified chronic kidney disease: Secondary | ICD-10-CM | POA: Diagnosis not present

## 2018-01-18 DIAGNOSIS — G629 Polyneuropathy, unspecified: Secondary | ICD-10-CM | POA: Diagnosis not present

## 2018-01-18 DIAGNOSIS — J189 Pneumonia, unspecified organism: Secondary | ICD-10-CM | POA: Diagnosis not present

## 2018-01-19 DIAGNOSIS — J189 Pneumonia, unspecified organism: Secondary | ICD-10-CM | POA: Diagnosis not present

## 2018-01-19 DIAGNOSIS — J40 Bronchitis, not specified as acute or chronic: Secondary | ICD-10-CM | POA: Diagnosis not present

## 2018-01-19 DIAGNOSIS — G629 Polyneuropathy, unspecified: Secondary | ICD-10-CM | POA: Diagnosis not present

## 2018-01-19 DIAGNOSIS — M797 Fibromyalgia: Secondary | ICD-10-CM | POA: Diagnosis not present

## 2018-01-19 DIAGNOSIS — N189 Chronic kidney disease, unspecified: Secondary | ICD-10-CM | POA: Diagnosis not present

## 2018-01-19 DIAGNOSIS — I129 Hypertensive chronic kidney disease with stage 1 through stage 4 chronic kidney disease, or unspecified chronic kidney disease: Secondary | ICD-10-CM | POA: Diagnosis not present

## 2018-01-21 DIAGNOSIS — I129 Hypertensive chronic kidney disease with stage 1 through stage 4 chronic kidney disease, or unspecified chronic kidney disease: Secondary | ICD-10-CM | POA: Diagnosis not present

## 2018-01-21 DIAGNOSIS — J40 Bronchitis, not specified as acute or chronic: Secondary | ICD-10-CM | POA: Diagnosis not present

## 2018-01-21 DIAGNOSIS — G629 Polyneuropathy, unspecified: Secondary | ICD-10-CM | POA: Diagnosis not present

## 2018-01-21 DIAGNOSIS — N189 Chronic kidney disease, unspecified: Secondary | ICD-10-CM | POA: Diagnosis not present

## 2018-01-21 DIAGNOSIS — M797 Fibromyalgia: Secondary | ICD-10-CM | POA: Diagnosis not present

## 2018-01-21 DIAGNOSIS — J189 Pneumonia, unspecified organism: Secondary | ICD-10-CM | POA: Diagnosis not present

## 2018-01-22 DIAGNOSIS — J189 Pneumonia, unspecified organism: Secondary | ICD-10-CM | POA: Diagnosis not present

## 2018-01-22 DIAGNOSIS — I129 Hypertensive chronic kidney disease with stage 1 through stage 4 chronic kidney disease, or unspecified chronic kidney disease: Secondary | ICD-10-CM | POA: Diagnosis not present

## 2018-01-22 DIAGNOSIS — J40 Bronchitis, not specified as acute or chronic: Secondary | ICD-10-CM | POA: Diagnosis not present

## 2018-01-22 DIAGNOSIS — G629 Polyneuropathy, unspecified: Secondary | ICD-10-CM | POA: Diagnosis not present

## 2018-01-22 DIAGNOSIS — M797 Fibromyalgia: Secondary | ICD-10-CM | POA: Diagnosis not present

## 2018-01-22 DIAGNOSIS — N189 Chronic kidney disease, unspecified: Secondary | ICD-10-CM | POA: Diagnosis not present

## 2018-01-24 DIAGNOSIS — J189 Pneumonia, unspecified organism: Secondary | ICD-10-CM | POA: Diagnosis not present

## 2018-01-24 DIAGNOSIS — N189 Chronic kidney disease, unspecified: Secondary | ICD-10-CM | POA: Diagnosis not present

## 2018-01-24 DIAGNOSIS — G629 Polyneuropathy, unspecified: Secondary | ICD-10-CM | POA: Diagnosis not present

## 2018-01-24 DIAGNOSIS — M797 Fibromyalgia: Secondary | ICD-10-CM | POA: Diagnosis not present

## 2018-01-24 DIAGNOSIS — J40 Bronchitis, not specified as acute or chronic: Secondary | ICD-10-CM | POA: Diagnosis not present

## 2018-01-24 DIAGNOSIS — I129 Hypertensive chronic kidney disease with stage 1 through stage 4 chronic kidney disease, or unspecified chronic kidney disease: Secondary | ICD-10-CM | POA: Diagnosis not present

## 2018-01-25 DIAGNOSIS — M797 Fibromyalgia: Secondary | ICD-10-CM | POA: Diagnosis not present

## 2018-01-25 DIAGNOSIS — I10 Essential (primary) hypertension: Secondary | ICD-10-CM | POA: Diagnosis not present

## 2018-01-25 DIAGNOSIS — M069 Rheumatoid arthritis, unspecified: Secondary | ICD-10-CM | POA: Diagnosis not present

## 2018-01-25 DIAGNOSIS — E1165 Type 2 diabetes mellitus with hyperglycemia: Secondary | ICD-10-CM | POA: Diagnosis not present

## 2018-01-25 DIAGNOSIS — G629 Polyneuropathy, unspecified: Secondary | ICD-10-CM | POA: Diagnosis not present

## 2018-01-25 DIAGNOSIS — J189 Pneumonia, unspecified organism: Secondary | ICD-10-CM | POA: Diagnosis not present

## 2018-01-25 DIAGNOSIS — J40 Bronchitis, not specified as acute or chronic: Secondary | ICD-10-CM | POA: Diagnosis not present

## 2018-01-25 DIAGNOSIS — E1122 Type 2 diabetes mellitus with diabetic chronic kidney disease: Secondary | ICD-10-CM | POA: Diagnosis not present

## 2018-01-25 DIAGNOSIS — Z6825 Body mass index (BMI) 25.0-25.9, adult: Secondary | ICD-10-CM | POA: Diagnosis not present

## 2018-01-25 DIAGNOSIS — Z299 Encounter for prophylactic measures, unspecified: Secondary | ICD-10-CM | POA: Diagnosis not present

## 2018-01-25 DIAGNOSIS — N189 Chronic kidney disease, unspecified: Secondary | ICD-10-CM | POA: Diagnosis not present

## 2018-01-25 DIAGNOSIS — I129 Hypertensive chronic kidney disease with stage 1 through stage 4 chronic kidney disease, or unspecified chronic kidney disease: Secondary | ICD-10-CM | POA: Diagnosis not present

## 2018-01-25 DIAGNOSIS — I739 Peripheral vascular disease, unspecified: Secondary | ICD-10-CM | POA: Diagnosis not present

## 2018-01-27 DIAGNOSIS — J189 Pneumonia, unspecified organism: Secondary | ICD-10-CM | POA: Diagnosis not present

## 2018-01-27 DIAGNOSIS — N189 Chronic kidney disease, unspecified: Secondary | ICD-10-CM | POA: Diagnosis not present

## 2018-01-27 DIAGNOSIS — I129 Hypertensive chronic kidney disease with stage 1 through stage 4 chronic kidney disease, or unspecified chronic kidney disease: Secondary | ICD-10-CM | POA: Diagnosis not present

## 2018-01-27 DIAGNOSIS — J40 Bronchitis, not specified as acute or chronic: Secondary | ICD-10-CM | POA: Diagnosis not present

## 2018-01-27 DIAGNOSIS — M797 Fibromyalgia: Secondary | ICD-10-CM | POA: Diagnosis not present

## 2018-01-27 DIAGNOSIS — G629 Polyneuropathy, unspecified: Secondary | ICD-10-CM | POA: Diagnosis not present

## 2018-01-31 DIAGNOSIS — G629 Polyneuropathy, unspecified: Secondary | ICD-10-CM | POA: Diagnosis not present

## 2018-01-31 DIAGNOSIS — J40 Bronchitis, not specified as acute or chronic: Secondary | ICD-10-CM | POA: Diagnosis not present

## 2018-01-31 DIAGNOSIS — J189 Pneumonia, unspecified organism: Secondary | ICD-10-CM | POA: Diagnosis not present

## 2018-01-31 DIAGNOSIS — N189 Chronic kidney disease, unspecified: Secondary | ICD-10-CM | POA: Diagnosis not present

## 2018-01-31 DIAGNOSIS — M797 Fibromyalgia: Secondary | ICD-10-CM | POA: Diagnosis not present

## 2018-01-31 DIAGNOSIS — I129 Hypertensive chronic kidney disease with stage 1 through stage 4 chronic kidney disease, or unspecified chronic kidney disease: Secondary | ICD-10-CM | POA: Diagnosis not present

## 2018-02-01 DIAGNOSIS — Z6826 Body mass index (BMI) 26.0-26.9, adult: Secondary | ICD-10-CM | POA: Diagnosis not present

## 2018-02-01 DIAGNOSIS — I639 Cerebral infarction, unspecified: Secondary | ICD-10-CM | POA: Diagnosis not present

## 2018-02-01 DIAGNOSIS — J449 Chronic obstructive pulmonary disease, unspecified: Secondary | ICD-10-CM | POA: Diagnosis not present

## 2018-02-01 DIAGNOSIS — Z299 Encounter for prophylactic measures, unspecified: Secondary | ICD-10-CM | POA: Diagnosis not present

## 2018-02-01 DIAGNOSIS — I10 Essential (primary) hypertension: Secondary | ICD-10-CM | POA: Diagnosis not present

## 2018-02-02 DIAGNOSIS — J471 Bronchiectasis with (acute) exacerbation: Secondary | ICD-10-CM | POA: Diagnosis not present

## 2018-02-02 DIAGNOSIS — R062 Wheezing: Secondary | ICD-10-CM | POA: Diagnosis not present

## 2018-02-02 DIAGNOSIS — J4541 Moderate persistent asthma with (acute) exacerbation: Secondary | ICD-10-CM | POA: Diagnosis not present

## 2018-02-03 DIAGNOSIS — M797 Fibromyalgia: Secondary | ICD-10-CM | POA: Diagnosis not present

## 2018-02-03 DIAGNOSIS — G629 Polyneuropathy, unspecified: Secondary | ICD-10-CM | POA: Diagnosis not present

## 2018-02-03 DIAGNOSIS — I129 Hypertensive chronic kidney disease with stage 1 through stage 4 chronic kidney disease, or unspecified chronic kidney disease: Secondary | ICD-10-CM | POA: Diagnosis not present

## 2018-02-03 DIAGNOSIS — J189 Pneumonia, unspecified organism: Secondary | ICD-10-CM | POA: Diagnosis not present

## 2018-02-03 DIAGNOSIS — N189 Chronic kidney disease, unspecified: Secondary | ICD-10-CM | POA: Diagnosis not present

## 2018-02-03 DIAGNOSIS — J40 Bronchitis, not specified as acute or chronic: Secondary | ICD-10-CM | POA: Diagnosis not present

## 2018-02-07 DIAGNOSIS — G629 Polyneuropathy, unspecified: Secondary | ICD-10-CM | POA: Diagnosis not present

## 2018-02-07 DIAGNOSIS — J189 Pneumonia, unspecified organism: Secondary | ICD-10-CM | POA: Diagnosis not present

## 2018-02-07 DIAGNOSIS — N189 Chronic kidney disease, unspecified: Secondary | ICD-10-CM | POA: Diagnosis not present

## 2018-02-07 DIAGNOSIS — I129 Hypertensive chronic kidney disease with stage 1 through stage 4 chronic kidney disease, or unspecified chronic kidney disease: Secondary | ICD-10-CM | POA: Diagnosis not present

## 2018-02-07 DIAGNOSIS — M797 Fibromyalgia: Secondary | ICD-10-CM | POA: Diagnosis not present

## 2018-02-07 DIAGNOSIS — J40 Bronchitis, not specified as acute or chronic: Secondary | ICD-10-CM | POA: Diagnosis not present

## 2018-02-08 DIAGNOSIS — J40 Bronchitis, not specified as acute or chronic: Secondary | ICD-10-CM | POA: Diagnosis not present

## 2018-02-08 DIAGNOSIS — M797 Fibromyalgia: Secondary | ICD-10-CM | POA: Diagnosis not present

## 2018-02-08 DIAGNOSIS — G629 Polyneuropathy, unspecified: Secondary | ICD-10-CM | POA: Diagnosis not present

## 2018-02-08 DIAGNOSIS — I129 Hypertensive chronic kidney disease with stage 1 through stage 4 chronic kidney disease, or unspecified chronic kidney disease: Secondary | ICD-10-CM | POA: Diagnosis not present

## 2018-02-08 DIAGNOSIS — J189 Pneumonia, unspecified organism: Secondary | ICD-10-CM | POA: Diagnosis not present

## 2018-02-08 DIAGNOSIS — N189 Chronic kidney disease, unspecified: Secondary | ICD-10-CM | POA: Diagnosis not present

## 2018-02-09 DIAGNOSIS — I129 Hypertensive chronic kidney disease with stage 1 through stage 4 chronic kidney disease, or unspecified chronic kidney disease: Secondary | ICD-10-CM | POA: Diagnosis not present

## 2018-02-09 DIAGNOSIS — N189 Chronic kidney disease, unspecified: Secondary | ICD-10-CM | POA: Diagnosis not present

## 2018-02-09 DIAGNOSIS — M797 Fibromyalgia: Secondary | ICD-10-CM | POA: Diagnosis not present

## 2018-02-09 DIAGNOSIS — J40 Bronchitis, not specified as acute or chronic: Secondary | ICD-10-CM | POA: Diagnosis not present

## 2018-02-09 DIAGNOSIS — G629 Polyneuropathy, unspecified: Secondary | ICD-10-CM | POA: Diagnosis not present

## 2018-02-09 DIAGNOSIS — J189 Pneumonia, unspecified organism: Secondary | ICD-10-CM | POA: Diagnosis not present

## 2018-02-14 DIAGNOSIS — M797 Fibromyalgia: Secondary | ICD-10-CM | POA: Diagnosis not present

## 2018-02-14 DIAGNOSIS — I129 Hypertensive chronic kidney disease with stage 1 through stage 4 chronic kidney disease, or unspecified chronic kidney disease: Secondary | ICD-10-CM | POA: Diagnosis not present

## 2018-02-14 DIAGNOSIS — G629 Polyneuropathy, unspecified: Secondary | ICD-10-CM | POA: Diagnosis not present

## 2018-02-14 DIAGNOSIS — J40 Bronchitis, not specified as acute or chronic: Secondary | ICD-10-CM | POA: Diagnosis not present

## 2018-02-14 DIAGNOSIS — J189 Pneumonia, unspecified organism: Secondary | ICD-10-CM | POA: Diagnosis not present

## 2018-02-14 DIAGNOSIS — N189 Chronic kidney disease, unspecified: Secondary | ICD-10-CM | POA: Diagnosis not present

## 2018-02-16 DIAGNOSIS — Z299 Encounter for prophylactic measures, unspecified: Secondary | ICD-10-CM | POA: Diagnosis not present

## 2018-02-16 DIAGNOSIS — J449 Chronic obstructive pulmonary disease, unspecified: Secondary | ICD-10-CM | POA: Diagnosis not present

## 2018-02-16 DIAGNOSIS — I10 Essential (primary) hypertension: Secondary | ICD-10-CM | POA: Diagnosis not present

## 2018-02-16 DIAGNOSIS — Z6825 Body mass index (BMI) 25.0-25.9, adult: Secondary | ICD-10-CM | POA: Diagnosis not present

## 2018-02-16 DIAGNOSIS — E1165 Type 2 diabetes mellitus with hyperglycemia: Secondary | ICD-10-CM | POA: Diagnosis not present

## 2018-02-16 DIAGNOSIS — E1122 Type 2 diabetes mellitus with diabetic chronic kidney disease: Secondary | ICD-10-CM | POA: Diagnosis not present

## 2018-02-17 DIAGNOSIS — I129 Hypertensive chronic kidney disease with stage 1 through stage 4 chronic kidney disease, or unspecified chronic kidney disease: Secondary | ICD-10-CM | POA: Diagnosis not present

## 2018-02-17 DIAGNOSIS — J189 Pneumonia, unspecified organism: Secondary | ICD-10-CM | POA: Diagnosis not present

## 2018-02-17 DIAGNOSIS — J40 Bronchitis, not specified as acute or chronic: Secondary | ICD-10-CM | POA: Diagnosis not present

## 2018-02-17 DIAGNOSIS — M797 Fibromyalgia: Secondary | ICD-10-CM | POA: Diagnosis not present

## 2018-02-17 DIAGNOSIS — G629 Polyneuropathy, unspecified: Secondary | ICD-10-CM | POA: Diagnosis not present

## 2018-02-17 DIAGNOSIS — N189 Chronic kidney disease, unspecified: Secondary | ICD-10-CM | POA: Diagnosis not present

## 2018-02-18 DIAGNOSIS — J9601 Acute respiratory failure with hypoxia: Secondary | ICD-10-CM | POA: Diagnosis present

## 2018-02-18 DIAGNOSIS — J189 Pneumonia, unspecified organism: Secondary | ICD-10-CM | POA: Diagnosis not present

## 2018-02-18 DIAGNOSIS — R0602 Shortness of breath: Secondary | ICD-10-CM | POA: Diagnosis not present

## 2018-02-18 DIAGNOSIS — Z79899 Other long term (current) drug therapy: Secondary | ICD-10-CM | POA: Diagnosis not present

## 2018-02-18 DIAGNOSIS — Z9981 Dependence on supplemental oxygen: Secondary | ICD-10-CM | POA: Diagnosis not present

## 2018-02-18 DIAGNOSIS — J9612 Chronic respiratory failure with hypercapnia: Secondary | ICD-10-CM | POA: Diagnosis not present

## 2018-02-18 DIAGNOSIS — I959 Hypotension, unspecified: Secondary | ICD-10-CM | POA: Diagnosis not present

## 2018-02-18 DIAGNOSIS — I10 Essential (primary) hypertension: Secondary | ICD-10-CM | POA: Diagnosis present

## 2018-02-18 DIAGNOSIS — Z8673 Personal history of transient ischemic attack (TIA), and cerebral infarction without residual deficits: Secondary | ICD-10-CM | POA: Diagnosis not present

## 2018-02-18 DIAGNOSIS — Z7982 Long term (current) use of aspirin: Secondary | ICD-10-CM | POA: Diagnosis not present

## 2018-02-18 DIAGNOSIS — J441 Chronic obstructive pulmonary disease with (acute) exacerbation: Secondary | ICD-10-CM | POA: Diagnosis not present

## 2018-02-18 DIAGNOSIS — J40 Bronchitis, not specified as acute or chronic: Secondary | ICD-10-CM | POA: Diagnosis not present

## 2018-02-18 DIAGNOSIS — N189 Chronic kidney disease, unspecified: Secondary | ICD-10-CM | POA: Diagnosis not present

## 2018-02-18 DIAGNOSIS — E875 Hyperkalemia: Secondary | ICD-10-CM | POA: Diagnosis not present

## 2018-02-18 DIAGNOSIS — K219 Gastro-esophageal reflux disease without esophagitis: Secondary | ICD-10-CM | POA: Diagnosis present

## 2018-02-18 DIAGNOSIS — E039 Hypothyroidism, unspecified: Secondary | ICD-10-CM | POA: Diagnosis present

## 2018-02-18 DIAGNOSIS — E871 Hypo-osmolality and hyponatremia: Secondary | ICD-10-CM | POA: Diagnosis not present

## 2018-02-18 DIAGNOSIS — M069 Rheumatoid arthritis, unspecified: Secondary | ICD-10-CM | POA: Diagnosis present

## 2018-02-18 DIAGNOSIS — G629 Polyneuropathy, unspecified: Secondary | ICD-10-CM | POA: Diagnosis not present

## 2018-02-18 DIAGNOSIS — R0603 Acute respiratory distress: Secondary | ICD-10-CM | POA: Diagnosis not present

## 2018-02-18 DIAGNOSIS — F329 Major depressive disorder, single episode, unspecified: Secondary | ICD-10-CM | POA: Diagnosis present

## 2018-02-18 DIAGNOSIS — J9602 Acute respiratory failure with hypercapnia: Secondary | ICD-10-CM | POA: Diagnosis present

## 2018-02-18 DIAGNOSIS — J440 Chronic obstructive pulmonary disease with acute lower respiratory infection: Secondary | ICD-10-CM | POA: Diagnosis not present

## 2018-02-18 DIAGNOSIS — M797 Fibromyalgia: Secondary | ICD-10-CM | POA: Diagnosis present

## 2018-02-18 DIAGNOSIS — A419 Sepsis, unspecified organism: Secondary | ICD-10-CM | POA: Diagnosis present

## 2018-02-18 DIAGNOSIS — I129 Hypertensive chronic kidney disease with stage 1 through stage 4 chronic kidney disease, or unspecified chronic kidney disease: Secondary | ICD-10-CM | POA: Diagnosis not present

## 2018-02-18 DIAGNOSIS — J449 Chronic obstructive pulmonary disease, unspecified: Secondary | ICD-10-CM | POA: Diagnosis not present

## 2018-02-18 DIAGNOSIS — G2581 Restless legs syndrome: Secondary | ICD-10-CM | POA: Diagnosis present

## 2018-02-23 DIAGNOSIS — J189 Pneumonia, unspecified organism: Secondary | ICD-10-CM | POA: Diagnosis not present

## 2018-02-23 DIAGNOSIS — G629 Polyneuropathy, unspecified: Secondary | ICD-10-CM | POA: Diagnosis not present

## 2018-02-23 DIAGNOSIS — J40 Bronchitis, not specified as acute or chronic: Secondary | ICD-10-CM | POA: Diagnosis not present

## 2018-02-23 DIAGNOSIS — I129 Hypertensive chronic kidney disease with stage 1 through stage 4 chronic kidney disease, or unspecified chronic kidney disease: Secondary | ICD-10-CM | POA: Diagnosis not present

## 2018-02-23 DIAGNOSIS — N189 Chronic kidney disease, unspecified: Secondary | ICD-10-CM | POA: Diagnosis not present

## 2018-02-23 DIAGNOSIS — M797 Fibromyalgia: Secondary | ICD-10-CM | POA: Diagnosis not present

## 2018-02-24 DIAGNOSIS — R0600 Dyspnea, unspecified: Secondary | ICD-10-CM | POA: Diagnosis not present

## 2018-02-24 DIAGNOSIS — G629 Polyneuropathy, unspecified: Secondary | ICD-10-CM | POA: Diagnosis not present

## 2018-02-24 DIAGNOSIS — I10 Essential (primary) hypertension: Secondary | ICD-10-CM | POA: Diagnosis present

## 2018-02-24 DIAGNOSIS — Z7982 Long term (current) use of aspirin: Secondary | ICD-10-CM | POA: Diagnosis not present

## 2018-02-24 DIAGNOSIS — M797 Fibromyalgia: Secondary | ICD-10-CM | POA: Diagnosis present

## 2018-02-24 DIAGNOSIS — E871 Hypo-osmolality and hyponatremia: Secondary | ICD-10-CM | POA: Diagnosis not present

## 2018-02-24 DIAGNOSIS — M069 Rheumatoid arthritis, unspecified: Secondary | ICD-10-CM | POA: Diagnosis not present

## 2018-02-24 DIAGNOSIS — Z8701 Personal history of pneumonia (recurrent): Secondary | ICD-10-CM | POA: Diagnosis not present

## 2018-02-24 DIAGNOSIS — E039 Hypothyroidism, unspecified: Secondary | ICD-10-CM | POA: Diagnosis present

## 2018-02-24 DIAGNOSIS — J449 Chronic obstructive pulmonary disease, unspecified: Secondary | ICD-10-CM | POA: Diagnosis present

## 2018-02-24 DIAGNOSIS — Z8673 Personal history of transient ischemic attack (TIA), and cerebral infarction without residual deficits: Secondary | ICD-10-CM | POA: Diagnosis not present

## 2018-02-24 DIAGNOSIS — Z9981 Dependence on supplemental oxygen: Secondary | ICD-10-CM | POA: Diagnosis not present

## 2018-02-24 DIAGNOSIS — K219 Gastro-esophageal reflux disease without esophagitis: Secondary | ICD-10-CM | POA: Diagnosis present

## 2018-02-26 DIAGNOSIS — M797 Fibromyalgia: Secondary | ICD-10-CM | POA: Diagnosis not present

## 2018-02-26 DIAGNOSIS — J40 Bronchitis, not specified as acute or chronic: Secondary | ICD-10-CM | POA: Diagnosis not present

## 2018-02-26 DIAGNOSIS — J189 Pneumonia, unspecified organism: Secondary | ICD-10-CM | POA: Diagnosis not present

## 2018-02-26 DIAGNOSIS — N189 Chronic kidney disease, unspecified: Secondary | ICD-10-CM | POA: Diagnosis not present

## 2018-02-26 DIAGNOSIS — I129 Hypertensive chronic kidney disease with stage 1 through stage 4 chronic kidney disease, or unspecified chronic kidney disease: Secondary | ICD-10-CM | POA: Diagnosis not present

## 2018-02-26 DIAGNOSIS — G629 Polyneuropathy, unspecified: Secondary | ICD-10-CM | POA: Diagnosis not present

## 2018-02-28 DIAGNOSIS — M797 Fibromyalgia: Secondary | ICD-10-CM | POA: Diagnosis not present

## 2018-02-28 DIAGNOSIS — N189 Chronic kidney disease, unspecified: Secondary | ICD-10-CM | POA: Diagnosis not present

## 2018-02-28 DIAGNOSIS — G629 Polyneuropathy, unspecified: Secondary | ICD-10-CM | POA: Diagnosis not present

## 2018-02-28 DIAGNOSIS — I129 Hypertensive chronic kidney disease with stage 1 through stage 4 chronic kidney disease, or unspecified chronic kidney disease: Secondary | ICD-10-CM | POA: Diagnosis not present

## 2018-02-28 DIAGNOSIS — J189 Pneumonia, unspecified organism: Secondary | ICD-10-CM | POA: Diagnosis not present

## 2018-02-28 DIAGNOSIS — J40 Bronchitis, not specified as acute or chronic: Secondary | ICD-10-CM | POA: Diagnosis not present

## 2018-03-02 DIAGNOSIS — R0602 Shortness of breath: Secondary | ICD-10-CM | POA: Diagnosis not present

## 2018-03-02 DIAGNOSIS — Z6825 Body mass index (BMI) 25.0-25.9, adult: Secondary | ICD-10-CM | POA: Diagnosis not present

## 2018-03-02 DIAGNOSIS — E1122 Type 2 diabetes mellitus with diabetic chronic kidney disease: Secondary | ICD-10-CM | POA: Diagnosis not present

## 2018-03-02 DIAGNOSIS — Z299 Encounter for prophylactic measures, unspecified: Secondary | ICD-10-CM | POA: Diagnosis not present

## 2018-03-02 DIAGNOSIS — E1165 Type 2 diabetes mellitus with hyperglycemia: Secondary | ICD-10-CM | POA: Diagnosis not present

## 2018-03-02 DIAGNOSIS — I10 Essential (primary) hypertension: Secondary | ICD-10-CM | POA: Diagnosis not present

## 2018-03-02 DIAGNOSIS — J449 Chronic obstructive pulmonary disease, unspecified: Secondary | ICD-10-CM | POA: Diagnosis not present

## 2018-03-04 ENCOUNTER — Ambulatory Visit (INDEPENDENT_AMBULATORY_CARE_PROVIDER_SITE_OTHER): Payer: Medicare Other | Admitting: Urology

## 2018-03-04 DIAGNOSIS — M797 Fibromyalgia: Secondary | ICD-10-CM | POA: Diagnosis not present

## 2018-03-04 DIAGNOSIS — G629 Polyneuropathy, unspecified: Secondary | ICD-10-CM | POA: Diagnosis not present

## 2018-03-04 DIAGNOSIS — R351 Nocturia: Secondary | ICD-10-CM | POA: Diagnosis not present

## 2018-03-04 DIAGNOSIS — N3020 Other chronic cystitis without hematuria: Secondary | ICD-10-CM | POA: Diagnosis not present

## 2018-03-04 DIAGNOSIS — I129 Hypertensive chronic kidney disease with stage 1 through stage 4 chronic kidney disease, or unspecified chronic kidney disease: Secondary | ICD-10-CM | POA: Diagnosis not present

## 2018-03-04 DIAGNOSIS — J189 Pneumonia, unspecified organism: Secondary | ICD-10-CM | POA: Diagnosis not present

## 2018-03-04 DIAGNOSIS — N189 Chronic kidney disease, unspecified: Secondary | ICD-10-CM | POA: Diagnosis not present

## 2018-03-04 DIAGNOSIS — J40 Bronchitis, not specified as acute or chronic: Secondary | ICD-10-CM | POA: Diagnosis not present

## 2018-03-07 DIAGNOSIS — G629 Polyneuropathy, unspecified: Secondary | ICD-10-CM | POA: Diagnosis not present

## 2018-03-07 DIAGNOSIS — J189 Pneumonia, unspecified organism: Secondary | ICD-10-CM | POA: Diagnosis not present

## 2018-03-07 DIAGNOSIS — J40 Bronchitis, not specified as acute or chronic: Secondary | ICD-10-CM | POA: Diagnosis not present

## 2018-03-07 DIAGNOSIS — N189 Chronic kidney disease, unspecified: Secondary | ICD-10-CM | POA: Diagnosis not present

## 2018-03-07 DIAGNOSIS — I129 Hypertensive chronic kidney disease with stage 1 through stage 4 chronic kidney disease, or unspecified chronic kidney disease: Secondary | ICD-10-CM | POA: Diagnosis not present

## 2018-03-07 DIAGNOSIS — M797 Fibromyalgia: Secondary | ICD-10-CM | POA: Diagnosis not present

## 2018-03-08 DIAGNOSIS — Z8673 Personal history of transient ischemic attack (TIA), and cerebral infarction without residual deficits: Secondary | ICD-10-CM | POA: Diagnosis not present

## 2018-03-08 DIAGNOSIS — M797 Fibromyalgia: Secondary | ICD-10-CM | POA: Diagnosis not present

## 2018-03-08 DIAGNOSIS — I129 Hypertensive chronic kidney disease with stage 1 through stage 4 chronic kidney disease, or unspecified chronic kidney disease: Secondary | ICD-10-CM | POA: Diagnosis not present

## 2018-03-08 DIAGNOSIS — J441 Chronic obstructive pulmonary disease with (acute) exacerbation: Secondary | ICD-10-CM | POA: Diagnosis not present

## 2018-03-08 DIAGNOSIS — M069 Rheumatoid arthritis, unspecified: Secondary | ICD-10-CM | POA: Diagnosis not present

## 2018-03-08 DIAGNOSIS — K219 Gastro-esophageal reflux disease without esophagitis: Secondary | ICD-10-CM | POA: Diagnosis not present

## 2018-03-08 DIAGNOSIS — J189 Pneumonia, unspecified organism: Secondary | ICD-10-CM | POA: Diagnosis not present

## 2018-03-08 DIAGNOSIS — F329 Major depressive disorder, single episode, unspecified: Secondary | ICD-10-CM | POA: Diagnosis not present

## 2018-03-08 DIAGNOSIS — J440 Chronic obstructive pulmonary disease with acute lower respiratory infection: Secondary | ICD-10-CM | POA: Diagnosis not present

## 2018-03-08 DIAGNOSIS — E039 Hypothyroidism, unspecified: Secondary | ICD-10-CM | POA: Diagnosis not present

## 2018-03-08 DIAGNOSIS — J9692 Respiratory failure, unspecified with hypercapnia: Secondary | ICD-10-CM | POA: Diagnosis not present

## 2018-03-08 DIAGNOSIS — G629 Polyneuropathy, unspecified: Secondary | ICD-10-CM | POA: Diagnosis not present

## 2018-03-08 DIAGNOSIS — J9611 Chronic respiratory failure with hypoxia: Secondary | ICD-10-CM | POA: Diagnosis not present

## 2018-03-08 DIAGNOSIS — H3530 Unspecified macular degeneration: Secondary | ICD-10-CM | POA: Diagnosis not present

## 2018-03-08 DIAGNOSIS — N189 Chronic kidney disease, unspecified: Secondary | ICD-10-CM | POA: Diagnosis not present

## 2018-03-09 DIAGNOSIS — I129 Hypertensive chronic kidney disease with stage 1 through stage 4 chronic kidney disease, or unspecified chronic kidney disease: Secondary | ICD-10-CM | POA: Diagnosis not present

## 2018-03-09 DIAGNOSIS — J189 Pneumonia, unspecified organism: Secondary | ICD-10-CM | POA: Diagnosis not present

## 2018-03-09 DIAGNOSIS — J9692 Respiratory failure, unspecified with hypercapnia: Secondary | ICD-10-CM | POA: Diagnosis not present

## 2018-03-09 DIAGNOSIS — J9611 Chronic respiratory failure with hypoxia: Secondary | ICD-10-CM | POA: Diagnosis not present

## 2018-03-09 DIAGNOSIS — J440 Chronic obstructive pulmonary disease with acute lower respiratory infection: Secondary | ICD-10-CM | POA: Diagnosis not present

## 2018-03-09 DIAGNOSIS — J441 Chronic obstructive pulmonary disease with (acute) exacerbation: Secondary | ICD-10-CM | POA: Diagnosis not present

## 2018-03-11 DIAGNOSIS — J9692 Respiratory failure, unspecified with hypercapnia: Secondary | ICD-10-CM | POA: Diagnosis not present

## 2018-03-11 DIAGNOSIS — J189 Pneumonia, unspecified organism: Secondary | ICD-10-CM | POA: Diagnosis not present

## 2018-03-11 DIAGNOSIS — J441 Chronic obstructive pulmonary disease with (acute) exacerbation: Secondary | ICD-10-CM | POA: Diagnosis not present

## 2018-03-11 DIAGNOSIS — J440 Chronic obstructive pulmonary disease with acute lower respiratory infection: Secondary | ICD-10-CM | POA: Diagnosis not present

## 2018-03-11 DIAGNOSIS — J9611 Chronic respiratory failure with hypoxia: Secondary | ICD-10-CM | POA: Diagnosis not present

## 2018-03-11 DIAGNOSIS — I129 Hypertensive chronic kidney disease with stage 1 through stage 4 chronic kidney disease, or unspecified chronic kidney disease: Secondary | ICD-10-CM | POA: Diagnosis not present

## 2018-03-15 DIAGNOSIS — J440 Chronic obstructive pulmonary disease with acute lower respiratory infection: Secondary | ICD-10-CM | POA: Diagnosis not present

## 2018-03-15 DIAGNOSIS — J9611 Chronic respiratory failure with hypoxia: Secondary | ICD-10-CM | POA: Diagnosis not present

## 2018-03-15 DIAGNOSIS — J189 Pneumonia, unspecified organism: Secondary | ICD-10-CM | POA: Diagnosis not present

## 2018-03-15 DIAGNOSIS — I129 Hypertensive chronic kidney disease with stage 1 through stage 4 chronic kidney disease, or unspecified chronic kidney disease: Secondary | ICD-10-CM | POA: Diagnosis not present

## 2018-03-15 DIAGNOSIS — H353221 Exudative age-related macular degeneration, left eye, with active choroidal neovascularization: Secondary | ICD-10-CM | POA: Diagnosis not present

## 2018-03-15 DIAGNOSIS — H353114 Nonexudative age-related macular degeneration, right eye, advanced atrophic with subfoveal involvement: Secondary | ICD-10-CM | POA: Diagnosis not present

## 2018-03-15 DIAGNOSIS — J441 Chronic obstructive pulmonary disease with (acute) exacerbation: Secondary | ICD-10-CM | POA: Diagnosis not present

## 2018-03-15 DIAGNOSIS — H541223 Low vision right eye category 2, blindness left eye category 3: Secondary | ICD-10-CM | POA: Diagnosis not present

## 2018-03-15 DIAGNOSIS — J9692 Respiratory failure, unspecified with hypercapnia: Secondary | ICD-10-CM | POA: Diagnosis not present

## 2018-03-16 DIAGNOSIS — J479 Bronchiectasis, uncomplicated: Secondary | ICD-10-CM | POA: Diagnosis not present

## 2018-03-17 DIAGNOSIS — J441 Chronic obstructive pulmonary disease with (acute) exacerbation: Secondary | ICD-10-CM | POA: Diagnosis not present

## 2018-03-17 DIAGNOSIS — J9692 Respiratory failure, unspecified with hypercapnia: Secondary | ICD-10-CM | POA: Diagnosis not present

## 2018-03-17 DIAGNOSIS — I129 Hypertensive chronic kidney disease with stage 1 through stage 4 chronic kidney disease, or unspecified chronic kidney disease: Secondary | ICD-10-CM | POA: Diagnosis not present

## 2018-03-17 DIAGNOSIS — J189 Pneumonia, unspecified organism: Secondary | ICD-10-CM | POA: Diagnosis not present

## 2018-03-17 DIAGNOSIS — J440 Chronic obstructive pulmonary disease with acute lower respiratory infection: Secondary | ICD-10-CM | POA: Diagnosis not present

## 2018-03-17 DIAGNOSIS — J9611 Chronic respiratory failure with hypoxia: Secondary | ICD-10-CM | POA: Diagnosis not present

## 2018-03-18 DIAGNOSIS — J440 Chronic obstructive pulmonary disease with acute lower respiratory infection: Secondary | ICD-10-CM | POA: Diagnosis not present

## 2018-03-18 DIAGNOSIS — I129 Hypertensive chronic kidney disease with stage 1 through stage 4 chronic kidney disease, or unspecified chronic kidney disease: Secondary | ICD-10-CM | POA: Diagnosis not present

## 2018-03-18 DIAGNOSIS — J9692 Respiratory failure, unspecified with hypercapnia: Secondary | ICD-10-CM | POA: Diagnosis not present

## 2018-03-18 DIAGNOSIS — J9611 Chronic respiratory failure with hypoxia: Secondary | ICD-10-CM | POA: Diagnosis not present

## 2018-03-18 DIAGNOSIS — J441 Chronic obstructive pulmonary disease with (acute) exacerbation: Secondary | ICD-10-CM | POA: Diagnosis not present

## 2018-03-18 DIAGNOSIS — J189 Pneumonia, unspecified organism: Secondary | ICD-10-CM | POA: Diagnosis not present

## 2018-03-19 DIAGNOSIS — J9692 Respiratory failure, unspecified with hypercapnia: Secondary | ICD-10-CM | POA: Diagnosis not present

## 2018-03-19 DIAGNOSIS — J189 Pneumonia, unspecified organism: Secondary | ICD-10-CM | POA: Diagnosis not present

## 2018-03-19 DIAGNOSIS — J441 Chronic obstructive pulmonary disease with (acute) exacerbation: Secondary | ICD-10-CM | POA: Diagnosis not present

## 2018-03-19 DIAGNOSIS — J440 Chronic obstructive pulmonary disease with acute lower respiratory infection: Secondary | ICD-10-CM | POA: Diagnosis not present

## 2018-03-19 DIAGNOSIS — I129 Hypertensive chronic kidney disease with stage 1 through stage 4 chronic kidney disease, or unspecified chronic kidney disease: Secondary | ICD-10-CM | POA: Diagnosis not present

## 2018-03-19 DIAGNOSIS — J9611 Chronic respiratory failure with hypoxia: Secondary | ICD-10-CM | POA: Diagnosis not present

## 2018-03-21 DIAGNOSIS — J440 Chronic obstructive pulmonary disease with acute lower respiratory infection: Secondary | ICD-10-CM | POA: Diagnosis not present

## 2018-03-21 DIAGNOSIS — J189 Pneumonia, unspecified organism: Secondary | ICD-10-CM | POA: Diagnosis not present

## 2018-03-21 DIAGNOSIS — J441 Chronic obstructive pulmonary disease with (acute) exacerbation: Secondary | ICD-10-CM | POA: Diagnosis not present

## 2018-03-21 DIAGNOSIS — I129 Hypertensive chronic kidney disease with stage 1 through stage 4 chronic kidney disease, or unspecified chronic kidney disease: Secondary | ICD-10-CM | POA: Diagnosis not present

## 2018-03-21 DIAGNOSIS — J9611 Chronic respiratory failure with hypoxia: Secondary | ICD-10-CM | POA: Diagnosis not present

## 2018-03-21 DIAGNOSIS — J9692 Respiratory failure, unspecified with hypercapnia: Secondary | ICD-10-CM | POA: Diagnosis not present

## 2018-03-22 DIAGNOSIS — J449 Chronic obstructive pulmonary disease, unspecified: Secondary | ICD-10-CM | POA: Diagnosis not present

## 2018-03-22 DIAGNOSIS — I10 Essential (primary) hypertension: Secondary | ICD-10-CM | POA: Diagnosis not present

## 2018-03-22 DIAGNOSIS — E1165 Type 2 diabetes mellitus with hyperglycemia: Secondary | ICD-10-CM | POA: Diagnosis not present

## 2018-03-22 DIAGNOSIS — Z6825 Body mass index (BMI) 25.0-25.9, adult: Secondary | ICD-10-CM | POA: Diagnosis not present

## 2018-03-22 DIAGNOSIS — E1122 Type 2 diabetes mellitus with diabetic chronic kidney disease: Secondary | ICD-10-CM | POA: Diagnosis not present

## 2018-03-22 DIAGNOSIS — Z299 Encounter for prophylactic measures, unspecified: Secondary | ICD-10-CM | POA: Diagnosis not present

## 2018-03-23 DIAGNOSIS — J440 Chronic obstructive pulmonary disease with acute lower respiratory infection: Secondary | ICD-10-CM | POA: Diagnosis not present

## 2018-03-23 DIAGNOSIS — J189 Pneumonia, unspecified organism: Secondary | ICD-10-CM | POA: Diagnosis not present

## 2018-03-23 DIAGNOSIS — J9692 Respiratory failure, unspecified with hypercapnia: Secondary | ICD-10-CM | POA: Diagnosis not present

## 2018-03-23 DIAGNOSIS — J9611 Chronic respiratory failure with hypoxia: Secondary | ICD-10-CM | POA: Diagnosis not present

## 2018-03-23 DIAGNOSIS — I129 Hypertensive chronic kidney disease with stage 1 through stage 4 chronic kidney disease, or unspecified chronic kidney disease: Secondary | ICD-10-CM | POA: Diagnosis not present

## 2018-03-23 DIAGNOSIS — J441 Chronic obstructive pulmonary disease with (acute) exacerbation: Secondary | ICD-10-CM | POA: Diagnosis not present

## 2018-03-24 DIAGNOSIS — J441 Chronic obstructive pulmonary disease with (acute) exacerbation: Secondary | ICD-10-CM | POA: Diagnosis not present

## 2018-03-24 DIAGNOSIS — J189 Pneumonia, unspecified organism: Secondary | ICD-10-CM | POA: Diagnosis not present

## 2018-03-24 DIAGNOSIS — I129 Hypertensive chronic kidney disease with stage 1 through stage 4 chronic kidney disease, or unspecified chronic kidney disease: Secondary | ICD-10-CM | POA: Diagnosis not present

## 2018-03-24 DIAGNOSIS — J9692 Respiratory failure, unspecified with hypercapnia: Secondary | ICD-10-CM | POA: Diagnosis not present

## 2018-03-24 DIAGNOSIS — J440 Chronic obstructive pulmonary disease with acute lower respiratory infection: Secondary | ICD-10-CM | POA: Diagnosis not present

## 2018-03-24 DIAGNOSIS — J9611 Chronic respiratory failure with hypoxia: Secondary | ICD-10-CM | POA: Diagnosis not present

## 2018-03-25 DIAGNOSIS — J9692 Respiratory failure, unspecified with hypercapnia: Secondary | ICD-10-CM | POA: Diagnosis not present

## 2018-03-25 DIAGNOSIS — J440 Chronic obstructive pulmonary disease with acute lower respiratory infection: Secondary | ICD-10-CM | POA: Diagnosis not present

## 2018-03-25 DIAGNOSIS — J441 Chronic obstructive pulmonary disease with (acute) exacerbation: Secondary | ICD-10-CM | POA: Diagnosis not present

## 2018-03-25 DIAGNOSIS — I129 Hypertensive chronic kidney disease with stage 1 through stage 4 chronic kidney disease, or unspecified chronic kidney disease: Secondary | ICD-10-CM | POA: Diagnosis not present

## 2018-03-25 DIAGNOSIS — J9611 Chronic respiratory failure with hypoxia: Secondary | ICD-10-CM | POA: Diagnosis not present

## 2018-03-25 DIAGNOSIS — J189 Pneumonia, unspecified organism: Secondary | ICD-10-CM | POA: Diagnosis not present

## 2018-03-26 DIAGNOSIS — J9611 Chronic respiratory failure with hypoxia: Secondary | ICD-10-CM | POA: Diagnosis not present

## 2018-03-26 DIAGNOSIS — I129 Hypertensive chronic kidney disease with stage 1 through stage 4 chronic kidney disease, or unspecified chronic kidney disease: Secondary | ICD-10-CM | POA: Diagnosis not present

## 2018-03-26 DIAGNOSIS — J441 Chronic obstructive pulmonary disease with (acute) exacerbation: Secondary | ICD-10-CM | POA: Diagnosis not present

## 2018-03-26 DIAGNOSIS — J9692 Respiratory failure, unspecified with hypercapnia: Secondary | ICD-10-CM | POA: Diagnosis not present

## 2018-03-26 DIAGNOSIS — J189 Pneumonia, unspecified organism: Secondary | ICD-10-CM | POA: Diagnosis not present

## 2018-03-26 DIAGNOSIS — J440 Chronic obstructive pulmonary disease with acute lower respiratory infection: Secondary | ICD-10-CM | POA: Diagnosis not present

## 2018-03-28 DIAGNOSIS — J9692 Respiratory failure, unspecified with hypercapnia: Secondary | ICD-10-CM | POA: Diagnosis not present

## 2018-03-28 DIAGNOSIS — I129 Hypertensive chronic kidney disease with stage 1 through stage 4 chronic kidney disease, or unspecified chronic kidney disease: Secondary | ICD-10-CM | POA: Diagnosis not present

## 2018-03-28 DIAGNOSIS — J189 Pneumonia, unspecified organism: Secondary | ICD-10-CM | POA: Diagnosis not present

## 2018-03-28 DIAGNOSIS — J441 Chronic obstructive pulmonary disease with (acute) exacerbation: Secondary | ICD-10-CM | POA: Diagnosis not present

## 2018-03-28 DIAGNOSIS — J440 Chronic obstructive pulmonary disease with acute lower respiratory infection: Secondary | ICD-10-CM | POA: Diagnosis not present

## 2018-03-28 DIAGNOSIS — J9611 Chronic respiratory failure with hypoxia: Secondary | ICD-10-CM | POA: Diagnosis not present

## 2018-03-29 DIAGNOSIS — J9692 Respiratory failure, unspecified with hypercapnia: Secondary | ICD-10-CM | POA: Diagnosis not present

## 2018-03-29 DIAGNOSIS — I129 Hypertensive chronic kidney disease with stage 1 through stage 4 chronic kidney disease, or unspecified chronic kidney disease: Secondary | ICD-10-CM | POA: Diagnosis not present

## 2018-03-29 DIAGNOSIS — J9611 Chronic respiratory failure with hypoxia: Secondary | ICD-10-CM | POA: Diagnosis not present

## 2018-03-29 DIAGNOSIS — J189 Pneumonia, unspecified organism: Secondary | ICD-10-CM | POA: Diagnosis not present

## 2018-03-29 DIAGNOSIS — J440 Chronic obstructive pulmonary disease with acute lower respiratory infection: Secondary | ICD-10-CM | POA: Diagnosis not present

## 2018-03-29 DIAGNOSIS — J441 Chronic obstructive pulmonary disease with (acute) exacerbation: Secondary | ICD-10-CM | POA: Diagnosis not present

## 2018-03-30 DIAGNOSIS — I129 Hypertensive chronic kidney disease with stage 1 through stage 4 chronic kidney disease, or unspecified chronic kidney disease: Secondary | ICD-10-CM | POA: Diagnosis not present

## 2018-03-30 DIAGNOSIS — J189 Pneumonia, unspecified organism: Secondary | ICD-10-CM | POA: Diagnosis not present

## 2018-03-30 DIAGNOSIS — J9692 Respiratory failure, unspecified with hypercapnia: Secondary | ICD-10-CM | POA: Diagnosis not present

## 2018-03-30 DIAGNOSIS — J441 Chronic obstructive pulmonary disease with (acute) exacerbation: Secondary | ICD-10-CM | POA: Diagnosis not present

## 2018-03-30 DIAGNOSIS — J9611 Chronic respiratory failure with hypoxia: Secondary | ICD-10-CM | POA: Diagnosis not present

## 2018-03-30 DIAGNOSIS — J440 Chronic obstructive pulmonary disease with acute lower respiratory infection: Secondary | ICD-10-CM | POA: Diagnosis not present

## 2018-03-31 DIAGNOSIS — J189 Pneumonia, unspecified organism: Secondary | ICD-10-CM | POA: Diagnosis not present

## 2018-03-31 DIAGNOSIS — J441 Chronic obstructive pulmonary disease with (acute) exacerbation: Secondary | ICD-10-CM | POA: Diagnosis not present

## 2018-03-31 DIAGNOSIS — I129 Hypertensive chronic kidney disease with stage 1 through stage 4 chronic kidney disease, or unspecified chronic kidney disease: Secondary | ICD-10-CM | POA: Diagnosis not present

## 2018-03-31 DIAGNOSIS — J440 Chronic obstructive pulmonary disease with acute lower respiratory infection: Secondary | ICD-10-CM | POA: Diagnosis not present

## 2018-03-31 DIAGNOSIS — J9692 Respiratory failure, unspecified with hypercapnia: Secondary | ICD-10-CM | POA: Diagnosis not present

## 2018-03-31 DIAGNOSIS — J9611 Chronic respiratory failure with hypoxia: Secondary | ICD-10-CM | POA: Diagnosis not present

## 2018-04-05 DIAGNOSIS — M5030 Other cervical disc degeneration, unspecified cervical region: Secondary | ICD-10-CM | POA: Diagnosis not present

## 2018-04-05 DIAGNOSIS — S0093XA Contusion of unspecified part of head, initial encounter: Secondary | ICD-10-CM | POA: Diagnosis not present

## 2018-04-05 DIAGNOSIS — Z7951 Long term (current) use of inhaled steroids: Secondary | ICD-10-CM | POA: Diagnosis not present

## 2018-04-05 DIAGNOSIS — S0990XA Unspecified injury of head, initial encounter: Secondary | ICD-10-CM | POA: Diagnosis not present

## 2018-04-05 DIAGNOSIS — W1839XA Other fall on same level, initial encounter: Secondary | ICD-10-CM | POA: Diagnosis not present

## 2018-04-05 DIAGNOSIS — Z79899 Other long term (current) drug therapy: Secondary | ICD-10-CM | POA: Diagnosis not present

## 2018-04-05 DIAGNOSIS — W1830XA Fall on same level, unspecified, initial encounter: Secondary | ICD-10-CM | POA: Diagnosis not present

## 2018-04-05 DIAGNOSIS — M47812 Spondylosis without myelopathy or radiculopathy, cervical region: Secondary | ICD-10-CM | POA: Diagnosis not present

## 2018-04-05 DIAGNOSIS — S0003XA Contusion of scalp, initial encounter: Secondary | ICD-10-CM | POA: Diagnosis not present

## 2018-04-05 DIAGNOSIS — M479 Spondylosis, unspecified: Secondary | ICD-10-CM | POA: Diagnosis not present

## 2018-04-05 DIAGNOSIS — Z7982 Long term (current) use of aspirin: Secondary | ICD-10-CM | POA: Diagnosis not present

## 2018-04-05 DIAGNOSIS — M542 Cervicalgia: Secondary | ICD-10-CM | POA: Diagnosis not present

## 2018-04-05 DIAGNOSIS — S199XXA Unspecified injury of neck, initial encounter: Secondary | ICD-10-CM | POA: Diagnosis not present

## 2018-04-11 DIAGNOSIS — J9611 Chronic respiratory failure with hypoxia: Secondary | ICD-10-CM | POA: Diagnosis not present

## 2018-04-11 DIAGNOSIS — J189 Pneumonia, unspecified organism: Secondary | ICD-10-CM | POA: Diagnosis not present

## 2018-04-11 DIAGNOSIS — J9692 Respiratory failure, unspecified with hypercapnia: Secondary | ICD-10-CM | POA: Diagnosis not present

## 2018-04-11 DIAGNOSIS — J440 Chronic obstructive pulmonary disease with acute lower respiratory infection: Secondary | ICD-10-CM | POA: Diagnosis not present

## 2018-04-11 DIAGNOSIS — I129 Hypertensive chronic kidney disease with stage 1 through stage 4 chronic kidney disease, or unspecified chronic kidney disease: Secondary | ICD-10-CM | POA: Diagnosis not present

## 2018-04-11 DIAGNOSIS — J441 Chronic obstructive pulmonary disease with (acute) exacerbation: Secondary | ICD-10-CM | POA: Diagnosis not present

## 2018-04-12 DIAGNOSIS — J9622 Acute and chronic respiratory failure with hypercapnia: Secondary | ICD-10-CM | POA: Diagnosis not present

## 2018-04-12 DIAGNOSIS — Z66 Do not resuscitate: Secondary | ICD-10-CM | POA: Diagnosis not present

## 2018-04-12 DIAGNOSIS — J9692 Respiratory failure, unspecified with hypercapnia: Secondary | ICD-10-CM | POA: Diagnosis not present

## 2018-04-12 DIAGNOSIS — Z79899 Other long term (current) drug therapy: Secondary | ICD-10-CM | POA: Diagnosis not present

## 2018-04-12 DIAGNOSIS — J189 Pneumonia, unspecified organism: Secondary | ICD-10-CM | POA: Diagnosis not present

## 2018-04-12 DIAGNOSIS — J209 Acute bronchitis, unspecified: Secondary | ICD-10-CM | POA: Diagnosis present

## 2018-04-12 DIAGNOSIS — Z8673 Personal history of transient ischemic attack (TIA), and cerebral infarction without residual deficits: Secondary | ICD-10-CM | POA: Diagnosis not present

## 2018-04-12 DIAGNOSIS — J984 Other disorders of lung: Secondary | ICD-10-CM | POA: Diagnosis not present

## 2018-04-12 DIAGNOSIS — E1122 Type 2 diabetes mellitus with diabetic chronic kidney disease: Secondary | ICD-10-CM | POA: Diagnosis present

## 2018-04-12 DIAGNOSIS — N390 Urinary tract infection, site not specified: Secondary | ICD-10-CM | POA: Diagnosis not present

## 2018-04-12 DIAGNOSIS — J470 Bronchiectasis with acute lower respiratory infection: Secondary | ICD-10-CM | POA: Diagnosis present

## 2018-04-12 DIAGNOSIS — J441 Chronic obstructive pulmonary disease with (acute) exacerbation: Secondary | ICD-10-CM | POA: Diagnosis not present

## 2018-04-12 DIAGNOSIS — J9611 Chronic respiratory failure with hypoxia: Secondary | ICD-10-CM | POA: Diagnosis not present

## 2018-04-12 DIAGNOSIS — E871 Hypo-osmolality and hyponatremia: Secondary | ICD-10-CM | POA: Diagnosis not present

## 2018-04-12 DIAGNOSIS — I480 Paroxysmal atrial fibrillation: Secondary | ICD-10-CM | POA: Diagnosis not present

## 2018-04-12 DIAGNOSIS — R531 Weakness: Secondary | ICD-10-CM | POA: Diagnosis present

## 2018-04-12 DIAGNOSIS — E860 Dehydration: Secondary | ICD-10-CM | POA: Diagnosis present

## 2018-04-12 DIAGNOSIS — N179 Acute kidney failure, unspecified: Secondary | ICD-10-CM | POA: Diagnosis not present

## 2018-04-12 DIAGNOSIS — N183 Chronic kidney disease, stage 3 (moderate): Secondary | ICD-10-CM | POA: Diagnosis present

## 2018-04-12 DIAGNOSIS — K219 Gastro-esophageal reflux disease without esophagitis: Secondary | ICD-10-CM | POA: Diagnosis present

## 2018-04-12 DIAGNOSIS — J9621 Acute and chronic respiratory failure with hypoxia: Secondary | ICD-10-CM | POA: Diagnosis not present

## 2018-04-12 DIAGNOSIS — R0902 Hypoxemia: Secondary | ICD-10-CM | POA: Diagnosis not present

## 2018-04-12 DIAGNOSIS — K760 Fatty (change of) liver, not elsewhere classified: Secondary | ICD-10-CM | POA: Diagnosis not present

## 2018-04-12 DIAGNOSIS — I129 Hypertensive chronic kidney disease with stage 1 through stage 4 chronic kidney disease, or unspecified chronic kidney disease: Secondary | ICD-10-CM | POA: Diagnosis present

## 2018-04-12 DIAGNOSIS — J440 Chronic obstructive pulmonary disease with acute lower respiratory infection: Secondary | ICD-10-CM | POA: Diagnosis not present

## 2018-04-12 DIAGNOSIS — B957 Other staphylococcus as the cause of diseases classified elsewhere: Secondary | ICD-10-CM | POA: Diagnosis not present

## 2018-04-12 DIAGNOSIS — A419 Sepsis, unspecified organism: Secondary | ICD-10-CM | POA: Diagnosis not present

## 2018-04-12 DIAGNOSIS — Z515 Encounter for palliative care: Secondary | ICD-10-CM | POA: Diagnosis not present

## 2018-04-12 DIAGNOSIS — R918 Other nonspecific abnormal finding of lung field: Secondary | ICD-10-CM | POA: Diagnosis not present

## 2018-04-12 DIAGNOSIS — K449 Diaphragmatic hernia without obstruction or gangrene: Secondary | ICD-10-CM | POA: Diagnosis not present

## 2018-04-12 DIAGNOSIS — E876 Hypokalemia: Secondary | ICD-10-CM | POA: Diagnosis not present

## 2018-05-14 DEATH — deceased

## 2018-07-28 ENCOUNTER — Encounter (INDEPENDENT_AMBULATORY_CARE_PROVIDER_SITE_OTHER): Payer: Medicare Other | Admitting: Ophthalmology
